# Patient Record
Sex: Male | Born: 1975
Health system: Southern US, Community
[De-identification: ages and names within clinical notes are randomized; demographics above are authoritative.]

## PROBLEM LIST (undated history)

## (undated) DIAGNOSIS — U071 COVID-19: Secondary | ICD-10-CM

## (undated) DIAGNOSIS — M109 Gout, unspecified: Secondary | ICD-10-CM

---

## 2008-10-16 ENCOUNTER — Emergency Department (HOSPITAL_COMMUNITY): Admission: EM | Admit: 2008-10-16 | Discharge: 2008-10-16 | Payer: Self-pay | Admitting: Emergency Medicine

## 2009-02-15 ENCOUNTER — Emergency Department (HOSPITAL_COMMUNITY): Admission: EM | Admit: 2009-02-15 | Discharge: 2009-02-16 | Payer: Self-pay | Admitting: Emergency Medicine

## 2010-06-29 LAB — POCT CARDIAC MARKERS
CKMB, poc: <1 ng/mL — ABNORMAL LOW (ref 1.0–8.0)
Myoglobin, poc: 53.8 ng/mL (ref 12–200)

## 2010-06-29 LAB — POCT I-STAT, CHEM 8
BUN: 6 mg/dL (ref 6–23)
Creatinine, Ser: 0.8 mg/dL (ref 0.4–1.5)
Potassium: 3.6 mEq/L (ref 3.5–5.1)
Sodium: 140 mEq/L (ref 135–145)

## 2010-06-29 LAB — CBC
Platelets: 221 10*3/uL (ref 150–400)
RBC: 5.76 MIL/uL (ref 4.22–5.81)
WBC: 7.5 10*3/uL (ref 4.0–10.5)

## 2010-06-29 LAB — D-DIMER, QUANTITATIVE: D-Dimer, Quant: <0.22 ug/mL-FEU (ref 0.00–0.48)

## 2010-06-29 LAB — DIFFERENTIAL
Lymphocytes Relative: 33 % (ref 12–46)
Lymphs Abs: 2.5 10*3/uL (ref 0.7–4.0)
Monocytes Relative: 4 % (ref 3–12)
Neutro Abs: 4.3 10*3/uL (ref 1.7–7.7)
Neutrophils Relative %: 58 % (ref 43–77)

## 2010-11-16 IMAGING — CR DG CHEST 2V
2 series · 2 of 2 positions shown · non-contrast
Comparison: None

CLINICAL DATA: Chest pain

CHEST - 2 VIEW

[w chest pa]
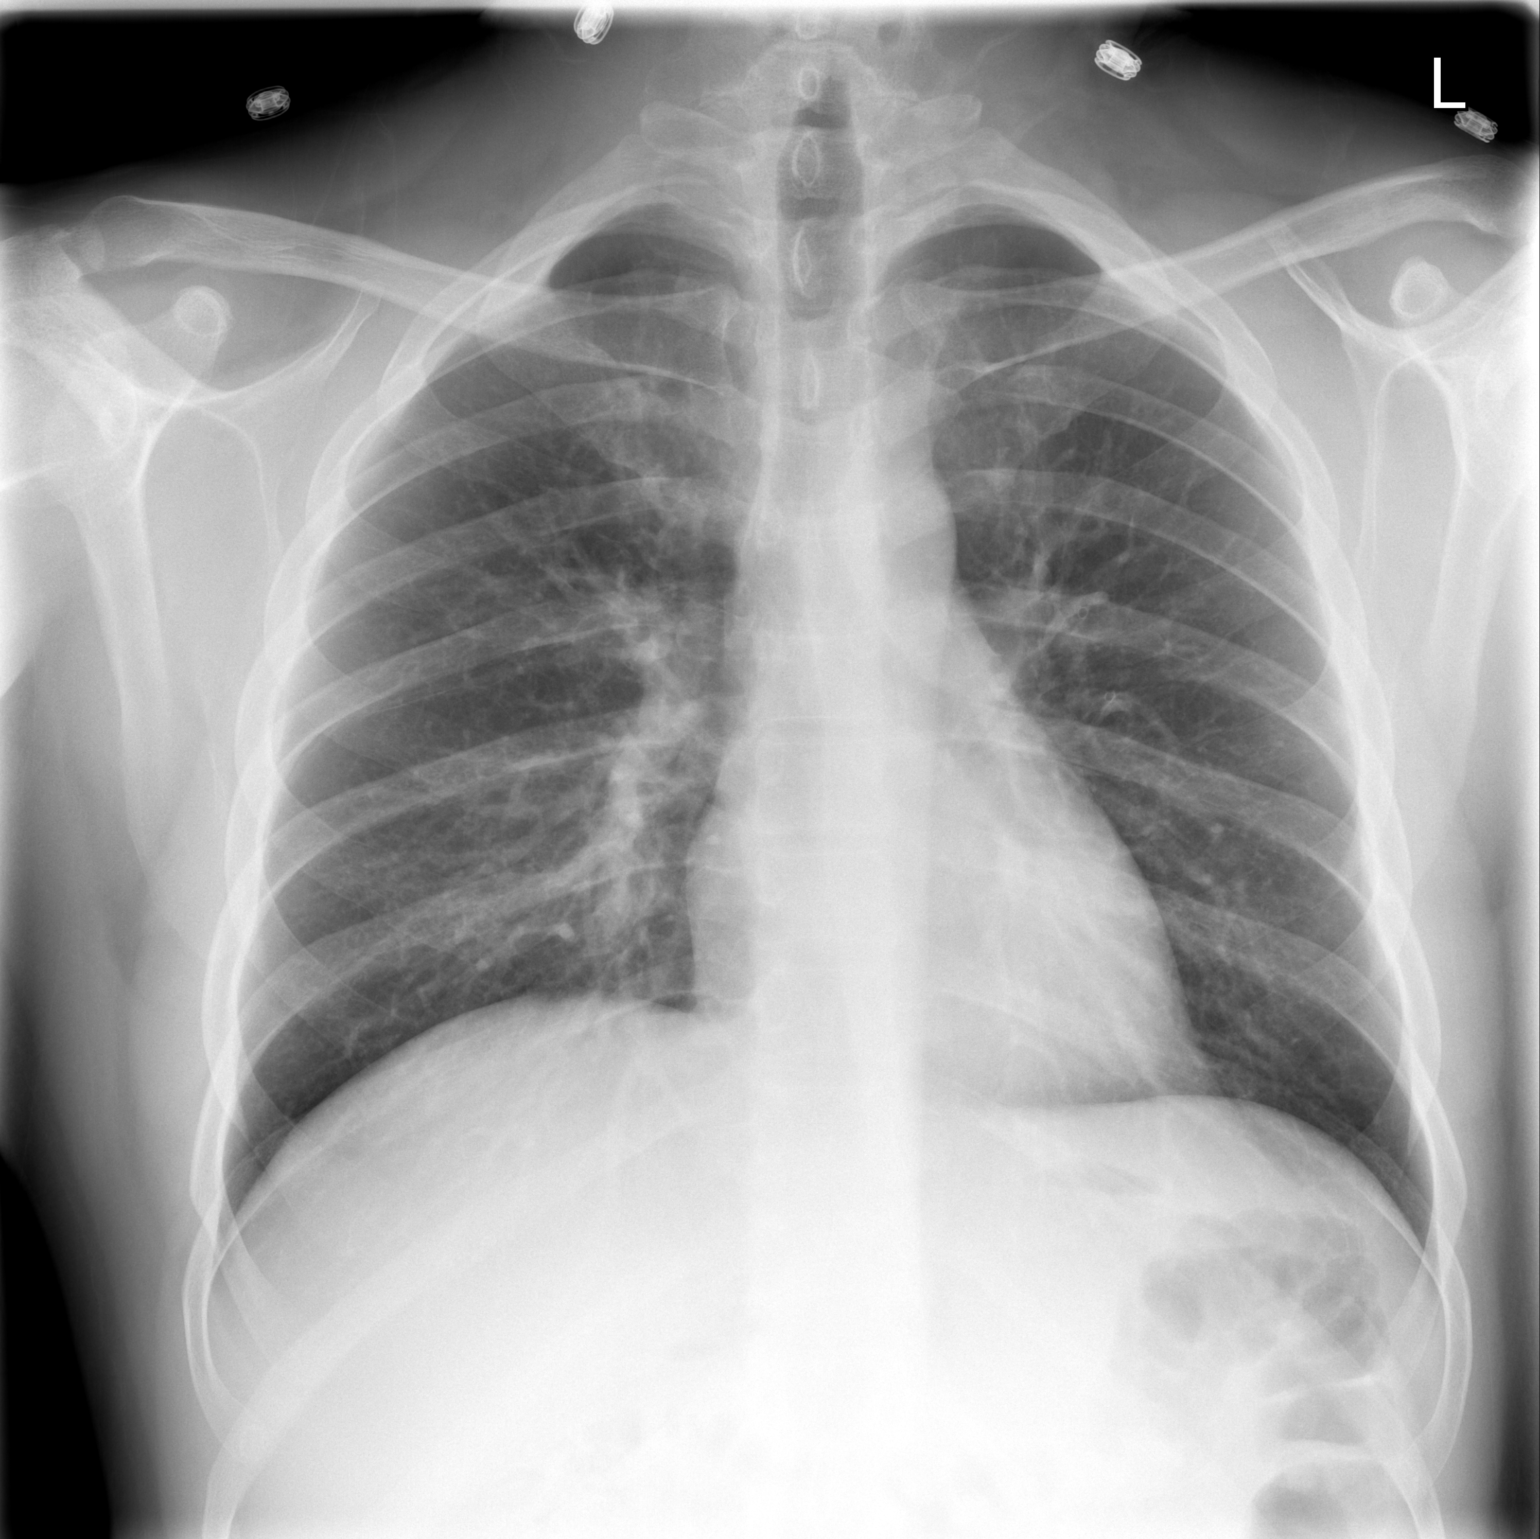

[w chest lat]
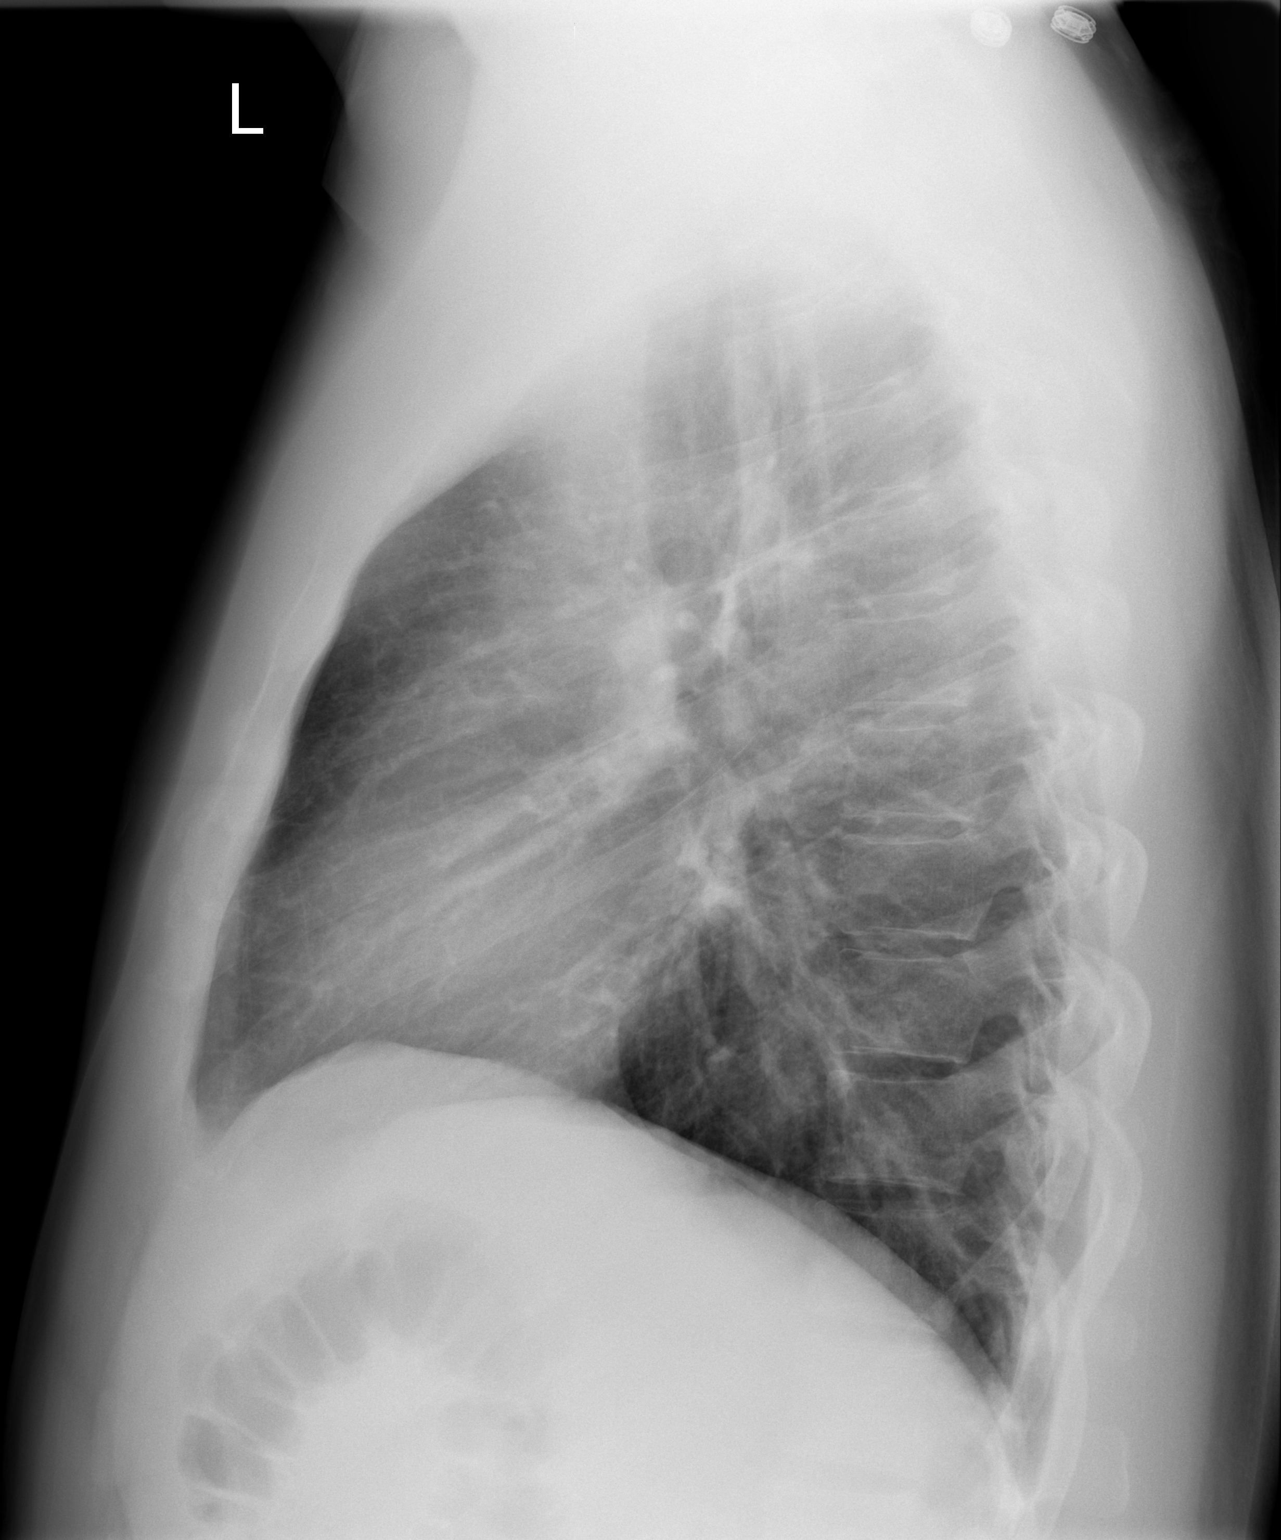

[2 of 2 positions shown; findings below may reference images not displayed]

FINDINGS: Heart and mediastinal contours are within normal limits.
No focal opacities or effusions.  No acute bony abnormality.
IMPRESSION: No acute findings.

## 2010-12-20 ENCOUNTER — Emergency Department (HOSPITAL_COMMUNITY)
Admission: EM | Admit: 2010-12-20 | Discharge: 2010-12-20 | Disposition: A | Discharge status: Home / Self Care | Payer: Worker's Compensation | Attending: Emergency Medicine | Admitting: Emergency Medicine

## 2010-12-20 DIAGNOSIS — IMO0002 Reserved for concepts with insufficient information to code with codable children: Secondary | ICD-10-CM | POA: Insufficient documentation

## 2010-12-20 DIAGNOSIS — W1809XA Striking against other object with subsequent fall, initial encounter: Secondary | ICD-10-CM | POA: Insufficient documentation

## 2011-08-20 ENCOUNTER — Encounter (HOSPITAL_COMMUNITY): Payer: Self-pay | Admitting: *Deleted

## 2011-08-20 ENCOUNTER — Emergency Department (HOSPITAL_COMMUNITY)
Admission: EM | Admit: 2011-08-20 | Discharge: 2011-08-20 | Disposition: A | Discharge status: Home / Self Care | Payer: Self-pay | Attending: Emergency Medicine | Admitting: Emergency Medicine

## 2011-08-20 DIAGNOSIS — M109 Gout, unspecified: Secondary | ICD-10-CM | POA: Insufficient documentation

## 2011-08-20 MED ORDER — OXYCODONE-ACETAMINOPHEN 5-325 MG PO TABS: 1.0000 | ORAL_TABLET | ORAL | Status: DC | PRN

## 2011-08-20 MED ORDER — IBUPROFEN 200 MG PO TABS
ORAL_TABLET | ORAL | Status: AC
  Filled 2011-08-20: qty 3

## 2011-08-20 MED ORDER — IBUPROFEN 600 MG PO TABS: 600.0000 mg | ORAL_TABLET | Freq: Three times a day (TID) | ORAL | Status: DC | PRN

## 2011-08-20 MED ORDER — IBUPROFEN 200 MG PO TABS
600.0000 mg | ORAL_TABLET | Freq: Once | ORAL | Status: AC
  Administered 2011-08-20: 600 mg via ORAL

## 2011-08-20 NOTE — Discharge Instructions (Signed)
Gout Gout is an inflammatory condition (arthritis) caused by a buildup of uric acid crystals in the joints. Uric acid is a chemical that is normally present in the blood. Under some circumstances, uric acid can form into crystals in your joints. This causes joint redness, soreness, and swelling (inflammation). Repeat attacks are common. Over time, uric acid crystals can form into masses (tophi) near a joint, causing disfigurement. Gout is treatable and often preventable. CAUSES  The disease begins with elevated levels of uric acid in the blood. Uric acid is produced by your body when it breaks down a naturally found substance called purines. This also happens when you eat certain foods such as meats and fish. Causes of an elevated uric acid level include:  Being passed down from parent to child (heredity).   Diseases that cause increased uric acid production (obesity, psoriasis, some cancers).   Excessive alcohol use.   Diet, especially diets rich in meat and seafood.   Medicines, including certain cancer-fighting drugs (chemotherapy), diuretics, and aspirin.   Chronic kidney disease. The kidneys are no longer able to remove uric acid well.   Problems with metabolism.  Conditions strongly associated with gout include:  Obesity.   High blood pressure.   High cholesterol.   Diabetes.  Not everyone with elevated uric acid levels gets gout. It is not understood why some people get gout and others do not. Surgery, joint injury, and eating too much of certain foods are some of the factors that can lead to gout. SYMPTOMS   An attack of gout comes on quickly. It causes intense pain with redness, swelling, and warmth in a joint.   Fever can occur.   Often, only one joint is involved. Certain joints are more commonly involved:   Base of the big toe.   Knee.   Ankle.   Wrist.   Finger.  Without treatment, an attack usually goes away in a few days to weeks. Between attacks, you  usually will not have symptoms, which is different from many other forms of arthritis. DIAGNOSIS  Your caregiver will suspect gout based on your symptoms and exam. Removal of fluid from the joint (arthrocentesis) is done to check for uric acid crystals. Your caregiver will give you a medicine that numbs the area (local anesthetic) and use a needle to remove joint fluid for exam. Gout is confirmed when uric acid crystals are seen in joint fluid, using a special microscope. Sometimes, blood, urine, and X-ray tests are also used. TREATMENT  There are 2 phases to gout treatment: treating the sudden onset (acute) attack and preventing attacks (prophylaxis). Treatment of an Acute Attack  Medicines are used. These include anti-inflammatory medicines or steroid medicines.   An injection of steroid medicine into the affected joint is sometimes necessary.   The painful joint is rested. Movement can worsen the arthritis.   You may use warm or cold treatments on painful joints, depending which works best for you.   Discuss the use of coffee, vitamin C, or cherries with your caregiver. These may be helpful treatment options.  Treatment to Prevent Attacks After the acute attack subsides, your caregiver may advise prophylactic medicine. These medicines either help your kidneys eliminate uric acid from your body or decrease your uric acid production. You may need to stay on these medicines for a very long time. The early phase of treatment with prophylactic medicine can be associated with an increase in acute gout attacks. For this reason, during the first few months   of treatment, your caregiver may also advise you to take medicines usually used for acute gout treatment. Be sure you understand your caregiver's directions. You should also discuss dietary treatment with your caregiver. Certain foods such as meats and fish can increase uric acid levels. Other foods such as dairy can decrease levels. Your caregiver  can give you a list of foods to avoid. HOME CARE INSTRUCTIONS   Do not take aspirin to relieve pain. This raises uric acid levels.   Only take over-the-counter or prescription medicines for pain, discomfort, or fever as directed by your caregiver.   Rest the joint as much as possible. When in bed, keep sheets and blankets off painful areas.   Keep the affected joint raised (elevated).   Use crutches if the painful joint is in your leg.   Drink enough water and fluids to keep your urine clear or pale yellow. This helps your body get rid of uric acid. Do not drink alcoholic beverages. They slow the passage of uric acid.   Follow your caregiver's dietary instructions. Pay careful attention to the amount of protein you eat. Your daily diet should emphasize fruits, vegetables, whole grains, and fat-free or low-fat milk products.   Maintain a healthy body weight.  SEEK MEDICAL CARE IF:   You have an oral temperature above 102 F (38.9 C).   You develop diarrhea, vomiting, or any side effects from medicines.   You do not feel better in 24 hours, or you are getting worse.  SEEK IMMEDIATE MEDICAL CARE IF:   Your joint becomes suddenly more tender and you have:   Chills.   An oral temperature above 102 F (38.9 C), not controlled by medicine.  MAKE SURE YOU:   Understand these instructions.   Will watch your condition.   Will get help right away if you are not doing well or get worse.  Document Released: 03/10/2000 Document Revised: 03/02/2011 Document Reviewed: 06/21/2009 [employer] Patient Information 2012 [employer].Purine Restricted Diet A low-purine diet consists of foods that reduce uric acid made in your body. INDICATIONS FOR USE  Your caregiver may ask you to follow a low-purine diet to reduce gout flairs.  GUIDELINES  Avoid high-purine foods, including all alcohol, yeast extracts taken as supplements, and sauces made from meats (like gravy). Do not eat high-purine  meats, including anchovies, sardines, herring, mussels, tuna, codfish, scallops, trout, haddock, bacon, organ meats, tripe, goose, wild game, and sweetbreads.  Grains  Allowed/Recommended: All, except those listed to consume in moderation.   Consume in Moderation: Oatmeal (? cup uncooked daily), wheat bran or germ ( cup daily), and whole grains.  Vegetables  Allowed/Recommended: All, except those listed to consume in moderation.   Consume in Moderation: Asparagus, cauliflower, spinach, mushrooms, and green peas ( cup daily).  Fruit  Allowed/Recommended: All.   Consume in Moderation: None.  Meat and Meat Substitutes  Allowed/Recommended: Eggs, nuts, and peanut butter.   Consume in Moderation: Limit to 4 to 6 oz daily. Avoid high-purine meats. Lentils, peas, and dried beans (1 cup daily).  Milk  Allowed/Recommended: All. Choose low-fat or skim when possible.   Consume in Moderation: None.  Fats and Oils  Allowed/Recommended: All.   Consume in Moderation: None.  Beverages  Allowed/Recommended: All, except those listed to avoid.   Avoid: All alcohol.  Condiments/Miscellaneous  Allowed/Recommended: All, except those listed to consume in moderation.   Consume in Moderation: Bouillon and meat-based broths and soups.  Document Released: 07/08/2010 Document Revised: 03/02/2011 Document   Reviewed: 07/08/2010 [employer] Patient Information 2012 [employer]. 

## 2011-08-20 NOTE — ED Notes (Signed)
Pt. Has c/o left toe pain that started Friday after coming from the park.  Pt. reports that pt. Stopped on his toe but he does not know what is causing the pain.  Pt. Denies any injury and reports pain with ambulation.

## 2011-08-20 NOTE — ED Provider Notes (Signed)
History   This chart was scribed for Lyanne Co, MD by Toya Smothers. The patient was seen in room STRE4/STRE4. Patient's care was started at 1038.  CSN: [id number]  Arrival date & time 08/20/11  1038   First MD Initiated Contact with Patient 08/20/11 1105      Chief Complaint  Patient presents with  . Toe Pain   Patient is a 36 y.o. male presenting with toe pain. The history is provided by the patient. No language interpreter was used.  Toe Pain Pertinent negatives include no shortness of breath.    Larry Shannon is a 36 y.o. male who presents to the Emergency Department complaining of sudden onset moderate severe constant worsening left toe pain onset last night listing no associate symptoms and without injury, denying fever and chill.Pt has a h/o alcohol use and a diet of red meat.   History reviewed. No pertinent past medical history.  History reviewed. No pertinent past surgical history.  History reviewed. No pertinent family history.  History  Substance Use Topics  . Smoking status: Not on file  . Smokeless tobacco: Not on file  . Alcohol Use: No    Review of Systems  Constitutional: Negative for fever and chills.  Respiratory: Negative for shortness of breath.   Gastrointestinal: Negative for nausea and vomiting.  Musculoskeletal:       Left toe pain  Neurological: Negative for weakness.   A complete 10 system review of systems was obtained and all systems are negative except as noted in the HPI and PMH.   Allergies  Review of patient's allergies indicates no known allergies.  Home Medications  No current outpatient prescriptions on file.  BP 132/78  Pulse 78  Temp(Src) 97.8 F (36.6 C) (Oral)  Resp 19  SpO2 100%  Physical Exam  Nursing note and vitals reviewed. Constitutional: He is oriented to person, place, and time. He appears well-developed and well-nourished. No distress.  HENT:  Head: Normocephalic and atraumatic.  Eyes: EOM are normal.  Pupils are equal, round, and reactive to light.  Neck: Neck supple. No tracheal deviation present.  Cardiovascular: Normal rate.   Pulmonary/Chest: Effort normal. No respiratory distress.  Abdominal: Soft. He exhibits no distension.  Musculoskeletal: Normal range of motion. He exhibits no edema.       Mild erethema L great toe proximal phalynx, pain w/ ROm in L toe, Perfusion of L great toe  Neurological: He is alert and oriented to person, place, and time. No sensory deficit.  Skin: Skin is warm and dry.  Psychiatric: He has a normal mood and affect. His behavior is normal.    ED Course  Procedures (including critical care time) DIAGNOSTIC STUDIES: Oxygen Saturation is 100% on room air, normal by my interpretation.    COORDINATION OF CARE:   Labs Reviewed - No data to display No results found.   1. Gout       MDM  Patient has what appears to be gout of his left great toe.  He was instructed in dietary changes.  He's been instructed to return to the ER for new or resting symptoms including signs of infection such as high fever or spreading redness.   I personally performed the services described in this documentation, which was scribed in my presence. The recorded information has been reviewed and considered.           Lyanne Co, MD 08/20/11 [phone]

## 2011-08-25 ENCOUNTER — Emergency Department (HOSPITAL_COMMUNITY)
Admission: EM | Admit: 2011-08-25 | Discharge: 2011-08-25 | Disposition: A | Discharge status: Home / Self Care | Payer: Self-pay | Attending: Emergency Medicine | Admitting: Emergency Medicine

## 2011-08-25 ENCOUNTER — Emergency Department (HOSPITAL_COMMUNITY): Payer: Self-pay

## 2011-08-25 ENCOUNTER — Encounter (HOSPITAL_COMMUNITY): Payer: Self-pay | Admitting: Emergency Medicine

## 2011-08-25 DIAGNOSIS — R7989 Other specified abnormal findings of blood chemistry: Secondary | ICD-10-CM | POA: Insufficient documentation

## 2011-08-25 DIAGNOSIS — E790 Hyperuricemia without signs of inflammatory arthritis and tophaceous disease: Secondary | ICD-10-CM

## 2011-08-25 DIAGNOSIS — M109 Gout, unspecified: Secondary | ICD-10-CM

## 2011-08-25 LAB — URIC ACID: Uric Acid, Serum: 9.3 mg/dL — ABNORMAL HIGH (ref 4.0–7.8)

## 2011-08-25 MED ORDER — PREDNISONE 20 MG PO TABS: 60.0000 mg | ORAL_TABLET | Freq: Every day | ORAL | Status: DC

## 2011-08-25 MED ORDER — INDOMETHACIN 50 MG PO CAPS: 50.0000 mg | ORAL_CAPSULE | Freq: Three times a day (TID) | ORAL | Status: DC

## 2011-08-25 MED ORDER — INDOMETHACIN 25 MG PO CAPS
50.0000 mg | ORAL_CAPSULE | Freq: Once | ORAL | Status: AC
  Administered 2011-08-25: 50 mg via ORAL
  Filled 2011-08-25: qty 2

## 2011-08-25 MED ORDER — OXYCODONE-ACETAMINOPHEN 5-325 MG PO TABS
2.0000 | ORAL_TABLET | Freq: Once | ORAL | Status: AC
  Administered 2011-08-25: 2 via ORAL
  Filled 2011-08-25: qty 2

## 2011-08-25 MED ORDER — PREDNISONE 20 MG PO TABS
60.0000 mg | ORAL_TABLET | Freq: Once | ORAL | Status: AC
  Administered 2011-08-25: 60 mg via ORAL
  Filled 2011-08-25: qty 3

## 2011-08-25 MED ORDER — OXYCODONE-ACETAMINOPHEN 7.5-325 MG PO TABS: 1.0000 | ORAL_TABLET | ORAL | Status: DC | PRN

## 2011-08-25 NOTE — ED Notes (Signed)
Pt with no obvious, swelling, bruising or deformities to left great toe. Distal circulation intact. Pt able to wiggle toes. States he was seen here and dx with gout but the pain medication isn't helping and his toe doesn't seem to be getting any better.

## 2011-08-25 NOTE — Discharge Instructions (Signed)
Gout Gout is an inflammatory condition (arthritis) caused by a buildup of uric acid crystals in the joints. Uric acid is a chemical that is normally present in the blood. Under some circumstances, uric acid can form into crystals in your joints. This causes joint redness, soreness, and swelling (inflammation). Repeat attacks are common. Over time, uric acid crystals can form into masses (tophi) near a joint, causing disfigurement. Gout is treatable and often preventable. CAUSES  The disease begins with elevated levels of uric acid in the blood. Uric acid is produced by your body when it breaks down a naturally found substance called purines. This also happens when you eat certain foods such as meats and fish. Causes of an elevated uric acid level include:  Being passed down from parent to child (heredity).   Diseases that cause increased uric acid production (obesity, psoriasis, some cancers).   Excessive alcohol use.   Diet, especially diets rich in meat and seafood.   Medicines, including certain cancer-fighting drugs (chemotherapy), diuretics, and aspirin.   Chronic kidney disease. The kidneys are no longer able to remove uric acid well.   Problems with metabolism.  Conditions strongly associated with gout include:  Obesity.   High blood pressure.   High cholesterol.   Diabetes.  Not everyone with elevated uric acid levels gets gout. It is not understood why some people get gout and others do not. Surgery, joint injury, and eating too much of certain foods are some of the factors that can lead to gout. SYMPTOMS   An attack of gout comes on quickly. It causes intense pain with redness, swelling, and warmth in a joint.   Fever can occur.   Often, only one joint is involved. Certain joints are more commonly involved:   Base of the big toe.   Knee.   Ankle.   Wrist.   Finger.  Without treatment, an attack usually goes away in a few days to weeks. Between attacks, you  usually will not have symptoms, which is different from many other forms of arthritis. DIAGNOSIS  Your caregiver will suspect gout based on your symptoms and exam. Removal of fluid from the joint (arthrocentesis) is done to check for uric acid crystals. Your caregiver will give you a medicine that numbs the area (local anesthetic) and use a needle to remove joint fluid for exam. Gout is confirmed when uric acid crystals are seen in joint fluid, using a special microscope. Sometimes, blood, urine, and X-ray tests are also used. TREATMENT  There are 2 phases to gout treatment: treating the sudden onset (acute) attack and preventing attacks (prophylaxis). Treatment of an Acute Attack  Medicines are used. These include anti-inflammatory medicines or steroid medicines.   An injection of steroid medicine into the affected joint is sometimes necessary.   The painful joint is rested. Movement can worsen the arthritis.   You may use warm or cold treatments on painful joints, depending which works best for you.   Discuss the use of coffee, vitamin C, or cherries with your caregiver. These may be helpful treatment options.  Treatment to Prevent Attacks After the acute attack subsides, your caregiver may advise prophylactic medicine. These medicines either help your kidneys eliminate uric acid from your body or decrease your uric acid production. You may need to stay on these medicines for a very long time. The early phase of treatment with prophylactic medicine can be associated with an increase in acute gout attacks. For this reason, during the first few months   of treatment, your caregiver may also advise you to take medicines usually used for acute gout treatment. Be sure you understand your caregiver's directions. You should also discuss dietary treatment with your caregiver. Certain foods such as meats and fish can increase uric acid levels. Other foods such as dairy can decrease levels. Your caregiver  can give you a list of foods to avoid. HOME CARE INSTRUCTIONS   Do not take aspirin to relieve pain. This raises uric acid levels.   Only take over-the-counter or prescription medicines for pain, discomfort, or fever as directed by your caregiver.   Rest the joint as much as possible. When in bed, keep sheets and blankets off painful areas.   Keep the affected joint raised (elevated).   Use crutches if the painful joint is in your leg.   Drink enough water and fluids to keep your urine clear or pale yellow. This helps your body get rid of uric acid. Do not drink alcoholic beverages. They slow the passage of uric acid.   Follow your caregiver's dietary instructions. Pay careful attention to the amount of protein you eat. Your daily diet should emphasize fruits, vegetables, whole grains, and fat-free or low-fat milk products.   Maintain a healthy body weight.  SEEK MEDICAL CARE IF:   You have an oral temperature above 102 F (38.9 C).   You develop diarrhea, vomiting, or any side effects from medicines.   You do not feel better in 24 hours, or you are getting worse.  SEEK IMMEDIATE MEDICAL CARE IF:   Your joint becomes suddenly more tender and you have:   Chills.   An oral temperature above 102 F (38.9 C), not controlled by medicine.  MAKE SURE YOU:   Understand these instructions.   Will watch your condition.   Will get help right away if you are not doing well or get worse.  Document Released: 03/10/2000 Document Revised: 03/02/2011 Document Reviewed: 06/21/2009 [facility] Patient Information 2012 [place], [place].  Purine Restricted Diet A low-purine diet consists of foods that reduce uric acid made in your body. INDICATIONS FOR USE  Your caregiver may ask you to follow a low-purine diet to reduce gout flairs.  GUIDELINES  Avoid high-purine foods, including all alcohol, yeast extracts taken as supplements, and sauces made from meats (like gravy). Do not eat high-purine  meats, including anchovies, sardines, herring, mussels, tuna, codfish, scallops, trout, haddock, bacon, organ meats, tripe, goose, wild game, and sweetbreads.  Grains  Allowed/Recommended: All, except those listed to consume in moderation.   Consume in Moderation: Oatmeal (? cup uncooked daily), wheat bran or germ ( cup daily), and whole grains.  Vegetables  Allowed/Recommended: All, except those listed to consume in moderation.   Consume in Moderation: Asparagus, cauliflower, spinach, mushrooms, and green peas ( cup daily).  Fruit  Allowed/Recommended: All.   Consume in Moderation: None.  Meat and Meat Substitutes  Allowed/Recommended: Eggs, nuts, and peanut butter.   Consume in Moderation: Limit to 4 to 6 oz daily. Avoid high-purine meats. Lentils, peas, and dried beans (1 cup daily).  Milk  Allowed/Recommended: All. Choose low-fat or skim when possible.   Consume in Moderation: None.  Fats and Oils  Allowed/Recommended: All.   Consume in Moderation: None.  Beverages  Allowed/Recommended: All, except those listed to avoid.   Avoid: All alcohol.  Condiments/Miscellaneous  Allowed/Recommended: All, except those listed to consume in moderation.   Consume in Moderation: Bouillon and meat-based broths and soups.  Document Released: 07/08/2010 Document Revised:  03/02/2011 Document Reviewed: 07/08/2010 [facility] Patient Information 2012 [place], [place].  Indomethacin capsules What is this medicine? INDOMETHACIN (in doe METH a sin) is a non-steroidal anti-inflammatory drug (NSAID). It is used to reduce swelling and to treat pain. It may be used for painful joint and muscular problems such as arthritis, tendinitis, bursitis, and gout. This medicine may be used for other purposes; ask your health care provider or pharmacist if you have questions. What should I tell my health care provider before I take this medicine? They need to know if you have any of these  conditions: -asthma, especially aspirin sensitive asthma -coronary artery bypass graft (CABG) surgery within the past 2 weeks -depression -drink more than 3 alcohol containing drinks a day -heart disease or circulation problems like heart failure or leg edema (fluid retention) -high blood pressure -kidney disease -liver disease -Parkinson's disease -seizures -stomach bleeding or ulcers -an unusual or allergic reaction to indomethacin, aspirin, other NSAIDs, other medicines, foods, dyes, or preservatives -pregnant or trying to get pregnant -breast-feeding How should I use this medicine? Take this medicine by mouth with food and with a full glass of water. Follow the directions on the prescription label. Take your medicine at regular intervals. Do not take your medicine more often than directed. Long-term, continuous use may increase the risk of heart attack or stroke. A special MedGuide will be given to you by the pharmacist with each prescription and refill. Be sure to read this information carefully each time. Talk to your pediatrician regarding the use of this medicine in children. Special care may be needed. While this drug may be prescribed for children as young as 15 years for selected conditions, precautions do apply. Elderly patients over 7 years old may have a stronger reaction and need a smaller dose. Overdosage: If you think you have taken too much of this medicine contact a poison control center or emergency room at once. NOTE: This medicine is only for you. Do not share this medicine with others. What if I miss a dose? If you miss a dose, take it as soon as you can. If it is almost time for your next dose, take only that dose. Do not take double or extra doses. What may interact with this medicine? Do not take this medicine with any of the following medications: -cidofovir -diflunisal -ketorolac -methotrexate -pemetrexed -triamterene This medicine may also interact with  the following medications: -alcohol -antacids -aspirin and aspirin-like medicines -cyclosporine -digoxin -diuretics -lithium -medicines for diabetes -medicines for high blood pressure -medicines that affect platelets -medicines that treat or prevent blood clots like warfarin -NSAIDs, medicines for pain and inflammation, like ibuprofen or naproxen -probenecid -steroid medicines like prednisone or cortisone This list may not describe all possible interactions. Give your health care provider a list of all the medicines, herbs, non-prescription drugs, or dietary supplements you use. Also tell them if you smoke, drink alcohol, or use illegal drugs. Some items may interact with your medicine. What should I watch for while using this medicine? Tell your doctor or health care professional if your pain does not get better. Talk to your doctor before taking another medicine for pain. Do not treat yourself. This medicine does not prevent heart attack or stroke. In fact, this medicine may increase the chance of a heart attack or stroke. The chance may increase with longer use of this medicine and in people who have heart disease. If you take aspirin to prevent heart attack or stroke, talk with your doctor or  health care professional. Do not take medicines such as ibuprofen and naproxen with this medicine. Side effects such as stomach upset, nausea, or ulcers may be more likely to occur. Many medicines available without a prescription should not be taken with this medicine. This medicine can cause ulcers and bleeding in the stomach and intestines at any time during treatment. Do not smoke cigarettes or drink alcohol. These increase irritation to your stomach and can make it more susceptible to damage from this medicine. Ulcers and bleeding can happen without warning symptoms and can cause death. You may get drowsy or dizzy. Do not drive, use machinery, or do anything that needs mental alertness until you  know how this medicine affects you. Do not stand or sit up quickly, especially if you are an older patient. This reduces the risk of dizzy or fainting spells. This medicine can cause you to bleed more easily. Try to avoid damage to your teeth and gums when you brush or floss your teeth. What side effects may I notice from receiving this medicine? Side effects that you should report to your doctor or health care professional as soon as possible: -allergic reactions like skin rash, itching or hives, swelling of the face, lips, or tongue -black or bloody stools, blood in the urine or vomit -blurred vision -chest pain -difficulty breathing or wheezing -nausea or vomiting -slurred speech or weakness on one side of the body -unexplained weight gain or swelling -unusually weak or tired -yellowing of eyes or skin Side effects that usually do not require medical attention (report to your doctor or health care professional if they continue or are bothersome): -diarrhea -dizziness -headache -heartburn This list may not describe all possible side effects. Call your doctor for medical advice about side effects. You may report side effects to FDA at 1-800-FDA-1088. Where should I keep my medicine? Keep out of the reach of children. Store at room temperature between 15 and 30 degrees C (59 and 86 degrees F). Keep container tightly closed. Throw away any unused medicine after the expiration date. NOTE: This sheet is a summary. It may not cover all possible information. If you have questions about this medicine, talk to your doctor, pharmacist, or health care provider.  2012, [employer]. (07/30/2007 1:35:33 PM)   Prednisone tablets What is this medicine? PREDNISONE (PRED ni sone) is a corticosteroid. It is commonly used to treat inflammation of the skin, joints, lungs, and other organs. Common conditions treated include asthma, allergies, and arthritis. It is also used for other conditions,  such as blood disorders and diseases of the adrenal glands. This medicine may be used for other purposes; ask your health care provider or pharmacist if you have questions. What should I tell my health care provider before I take this medicine? They need to know if you have any of these conditions: -Cushing's syndrome -diabetes -glaucoma -heart disease -high blood pressure -infection (especially a virus infection such as chickenpox, cold sores, or herpes) -kidney disease -liver disease -mental illness -myasthenia gravis -osteoporosis -seizures -stomach or intestine problems -thyroid disease -an unusual or allergic reaction to lactose, prednisone, other medicines, foods, dyes, or preservatives -pregnant or trying to get pregnant -breast-feeding How should I use this medicine? Take this medicine by mouth with a glass of water. Follow the directions on the prescription label. Take this medicine with food. If you are taking this medicine once a day, take it in the morning. Do not take more medicine than you are told to take. Do  not suddenly stop taking your medicine because you may develop a severe reaction. Your doctor will tell you how much medicine to take. If your doctor wants you to stop the medicine, the dose may be slowly lowered over time to avoid any side effects. Talk to your pediatrician regarding the use of this medicine in children. Special care may be needed. Overdosage: If you think you have taken too much of this medicine contact a poison control center or emergency room at once. NOTE: This medicine is only for you. Do not share this medicine with others. What if I miss a dose? If you miss a dose, take it as soon as you can. If it is almost time for your next dose, talk to your doctor or health care professional. You may need to miss a dose or take an extra dose. Do not take double or extra doses without advice. What may interact with this medicine? Do not take this medicine  with any of the following medications: -metyrapone -mifepristone This medicine may also interact with the following medications: -aminoglutethimide -amphotericin B -aspirin and aspirin-like medicines -barbiturates -certain medicines for diabetes, like glipizide or glyburide -cholestyramine -cholinesterase inhibitors -cyclosporine -digoxin -diuretics -ephedrine -male hormones, like estrogens and birth control pills -isoniazid -ketoconazole -NSAIDS, medicines for pain and inflammation, like ibuprofen or naproxen -phenytoin -rifampin -toxoids -vaccines -warfarin This list may not describe all possible interactions. Give your health care provider a list of all the medicines, herbs, non-prescription drugs, or dietary supplements you use. Also tell them if you smoke, drink alcohol, or use illegal drugs. Some items may interact with your medicine. What should I watch for while using this medicine? Visit your doctor or health care professional for regular checks on your progress. If you are taking this medicine over a prolonged period, carry an identification card with your name and address, the type and dose of your medicine, and your doctor's name and address. This medicine may increase your risk of getting an infection. Tell your doctor or health care professional if you are around anyone with measles or chickenpox, or if you develop sores or blisters that do not heal properly. If you are going to have surgery, tell your doctor or health care professional that you have taken this medicine within the last twelve months. Ask your doctor or health care professional about your diet. You may need to lower the amount of salt you eat. This medicine may affect blood sugar levels. If you have diabetes, check with your doctor or health care professional before you change your diet or the dose of your diabetic medicine. What side effects may I notice from receiving this medicine? Side effects that  you should report to your doctor or health care professional as soon as possible: -allergic reactions like skin rash, itching or hives, swelling of the face, lips, or tongue -changes in emotions or moods -changes in vision -depressed mood -eye pain -fever or chills, cough, sore throat, pain or difficulty passing urine -increased thirst -swelling of ankles, feet Side effects that usually do not require medical attention (report to your doctor or health care professional if they continue or are bothersome): -confusion, excitement, restlessness -headache -nausea, vomiting -skin problems, acne, thin and shiny skin -trouble sleeping -weight gain This list may not describe all possible side effects. Call your doctor for medical advice about side effects. You may report side effects to FDA at 1-800-FDA-1088. Where should I keep my medicine? Keep out of the reach of children. Store at  room temperature between 15 and 30 degrees C (59 and 86 degrees F). Protect from light. Keep container tightly closed. Throw away any unused medicine after the expiration date. NOTE: This sheet is a summary. It may not cover all possible information. If you have questions about this medicine, talk to your doctor, pharmacist, or health care provider.  2012, [employer]. (10/27/2010 10:57:14 AM)  Acetaminophen; Oxycodone tablets What is this medicine? ACETAMINOPHEN; OXYCODONE (a set a MEE noe fen; ox i KOE done) is a pain reliever. It is used to treat mild to moderate pain. This medicine may be used for other purposes; ask your health care provider or pharmacist if you have questions. What should I tell my health care provider before I take this medicine? They need to know if you have any of these conditions: -brain tumor -Crohn's disease, inflammatory bowel disease, or ulcerative colitis -drink more than 3 alcohol containing drinks per day -drug abuse or addiction -head injury -heart or circulation  problems -kidney disease or problems going to the bathroom -liver disease -lung disease, asthma, or breathing problems -an unusual or allergic reaction to acetaminophen, oxycodone, other opioid analgesics, other medicines, foods, dyes, or preservatives -pregnant or trying to get pregnant -breast-feeding How should I use this medicine? Take this medicine by mouth with a full glass of water. Follow the directions on the prescription label. Take your medicine at regular intervals. Do not take your medicine more often than directed. Talk to your pediatrician regarding the use of this medicine in children. Special care may be needed. Patients over 24 years old may have a stronger reaction and need a smaller dose. Overdosage: If you think you have taken too much of this medicine contact a poison control center or emergency room at once. NOTE: This medicine is only for you. Do not share this medicine with others. What if I miss a dose? If you miss a dose, take it as soon as you can. If it is almost time for your next dose, take only that dose. Do not take double or extra doses. What may interact with this medicine? -alcohol or medicines that contain alcohol -antihistamines -barbiturates like amobarbital, butalbital, butabarbital, methohexital, pentobarbital, phenobarbital, thiopental, and secobarbital -benztropine -drugs for bladder problems like solifenacin, trospium, oxybutynin, tolterodine, hyoscyamine, and methscopolamine -drugs for breathing problems like ipratropium and tiotropium -drugs for certain stomach or intestine problems like propantheline, homatropine methylbromide, glycopyrrolate, atropine, belladonna, and dicyclomine -general anesthetics like etomidate, ketamine, nitrous oxide, propofol, desflurane, enflurane, halothane, isoflurane, and sevoflurane -medicines for depression, anxiety, or psychotic disturbances -medicines for pain like codeine, morphine, pentazocine, buprenorphine,  butorphanol, nalbuphine, tramadol, and propoxyphene -medicines for sleep -muscle relaxants -naltrexone -phenothiazines like perphenazine, thioridazine, chlorpromazine, mesoridazine, fluphenazine, prochlorperazine, promazine, and trifluoperazine -scopolamine -trihexyphenidyl This list may not describe all possible interactions. Give your health care provider a list of all the medicines, herbs, non-prescription drugs, or dietary supplements you use. Also tell them if you smoke, drink alcohol, or use illegal drugs. Some items may interact with your medicine. What should I watch for while using this medicine? Tell your doctor or health care professional if your pain does not go away, if it gets worse, or if you have new or a different type of pain. You may develop tolerance to the medicine. Tolerance means that you will need a higher dose of the medication for pain relief. Tolerance is normal and is expected if you take this medicine for a long time. Do not suddenly stop taking your medicine because  you may develop a severe reaction. Your body becomes used to the medicine. This does NOT mean you are addicted. Addiction is a behavior related to getting and using a drug for a nonmedical reason. If you have pain, you have a medical reason to take pain medicine. Your doctor will tell you how much medicine to take. If your doctor wants you to stop the medicine, the dose will be slowly lowered over time to avoid any side effects. You may get drowsy or dizzy. Do not drive, use machinery, or do anything that needs mental alertness until you know how this medicine affects you. Do not stand or sit up quickly, especially if you are an older patient. This reduces the risk of dizzy or fainting spells. Alcohol may interfere with the effect of this medicine. Avoid alcoholic drinks. The medicine will cause constipation. Try to have a bowel movement at least every 2 to 3 days. If you do not have a bowel movement for 3 days,  call your doctor or health care professional. Do not take Tylenol (acetaminophen) or medicines that have acetaminophen with this medicine. Too much acetaminophen can be very dangerous. Many nonprescription medicines contain acetaminophen. Always read the labels carefully to avoid taking more acetaminophen. What side effects may I notice from receiving this medicine? Side effects that you should report to your doctor or health care professional as soon as possible: -allergic reactions like skin rash, itching or hives, swelling of the face, lips, or tongue -breathing difficulties, wheezing -confusion -light headedness or fainting spells -severe stomach pain -yellowing of the skin or the whites of the eyes Side effects that usually do not require medical attention (report to your doctor or health care professional if they continue or are bothersome): -dizziness -drowsiness -nausea -vomiting This list may not describe all possible side effects. Call your doctor for medical advice about side effects. You may report side effects to FDA at 1-800-FDA-1088. Where should I keep my medicine? Keep out of the reach of children. This medicine can be abused. Keep your medicine in a safe place to protect it from theft. Do not share this medicine with anyone. Selling or giving away this medicine is dangerous and against the law. Store at room temperature between 20 and 25 degrees C (68 and 77 degrees F). Keep container tightly closed. Protect from light. Flush any unused medicines down the toilet. Do not use the medicine after the expiration date. NOTE: This sheet is a summary. It may not cover all possible information. If you have questions about this medicine, talk to your doctor, pharmacist, or health care provider.  2012, Elsevier/Gold Standard. (02/10/2008 10:01:21 AM)  RESOURCE GUIDE  Chronic Pain Problems: Contact [facility]  [phone] Patients need to be referred by their  primary care doctor.  Insufficient Money for Medicine: Contact United Way:  call "211" or [facility] [phone].  No Primary Care Doctor: - Call [facility] Connect  [phone] - can help you locate a primary care doctor that  accepts your insurance, provides certain services, etc. - Physician Referral [phone]  Agencies that provide inexpensive medical care: - [facility]  [phone] - [facility]  [phone] - [facility]  [phone] [facility]  [phone] - Planned Parenthood  [phone] [facility]  [phone]  Medicaid-accepting [facility] Providers: - [facility]- [address]  [phone], Mon-Fri 9am-7pm, Sat 9am-1pm - Celesta Gentile  [facility]- [address] [place], [address] [place]  [phone] - [facility]- [address], [address] [place]  [phone] [facility]- [address]  [phone] - Renaye Rakers- [address] [place], [address], [phone]  Only accepts [place] patients after they have their name  applied to their card  Self Pay (no insurance) in [facility]: - Sickle Cell Patients: Dr Willey Blade, [facility]  [address] [place], [phone] - [facility]- [address] [place]  [phone]       [facility]- [address]       -     [facility]- see information above (Speak to [employer] if you do not have insurance)       -  [facility]- [address] [place], [phone]       -  [facility]- [address],  [phone]       -  [facility]- [address] [phone]       -  Dr Julio Sicks-  [address], [place], [phone]       -  [facility]- [address] [phone]       -  [facility]- [address], [phone], also [address] [phone]        -    [facility]- [address] [place], [phone], 1st & 3rd Saturday   every month, 10am-1pm  1) Find a Doctor and Pay Out of Pocket Although you won't have to find out who is covered by your insurance plan, it is a good idea to ask around and get recommendations. You will then need to call the office and see if the doctor you have chosen will accept you as a new patient and what types of options they offer for patients who are self-pay. Some doctors offer discounts or will set up payment plans for their patients who do not have insurance, but you will need to ask so you aren't surprised when you get to your appointment.  2) Contact Your Local Health Department Not all health departments have doctors that can see patients for sick visits, but many do, so it is worth a call to see if yours does. If you don't know where your local health department is, you can check in your phone book. The CDC also has a tool to help you locate your state's health department, and many state websites also have listings of all of their local health departments.  3) Find a Walk-in Clinic If your illness is not likely to be very severe or complicated, you may want to try a walk in clinic. These are popping up all over the country in pharmacies, drugstores, and shopping centers. They're usually staffed by nurse practitioners or physician assistants that have been trained to treat common illnesses and complaints. They're usually fairly quick and inexpensive. However, if you have serious medical issues or chronic medical problems, these are probably not your best option  STD Testing - [facility] Department of [facility], STD Clinic, [address] [place], phone [phone] or [phone].  Monday - Friday, call for an appointment. [facility] Department of [employer], STD Clinic, [address], [place], phone [phone] or [phone].  Monday -  Friday, call for an  appointment.  Abuse/Neglect: [facility] Child Abuse Hotline 5673100014 [facility] Child Abuse Hotline [phone] (After Hours)  Emergency Shelter:  [facility] [phone]  Maternity Homes: - [facility] [phone] - [facility] [phone]  MRSA Hotline #:   385-017-3739  [facility] Resources  [facility] [place]  United Way [facility] Dept. [address]                 [address]         [address] [place] [address]  Blondell Reveal Phone:  [phone]                                  Phone:  [phone]                   Phone:  [phone]  [facility], [phone] - [phone]       -     [facility] in [place], [address]                                  [phone], [facility] Child Abuse Hotline 636-338-7738 or 662-718-2839 (After Hours)   Behavioral Health Services  Substance Abuse Resources: - [facility]  [phone] - [facility] [phone] - [facility] [place] [phone] Floydene Flock ([phone] - Residential & Outpatient Substance Abuse Program  ([phone]  Psychological Services: [facility]  [phone] Services  [phone] - [facility], 431-500-1117 [place]. [address], [place], ACCESS LINE: 845-280-1453 or 878-700-8869, [employer]za  Dental Assistance  If unable to pay or uninsured, contact:  [facility] or [facility]. to become qualified for the adult dental clinic.  Patients with Medicaid: [facility] [phone] [address], [phone] [address] [phone]  If unable to pay,  or uninsured, contact [facility] 319-718-4586) or [facility] Department [phone] in [place], [phone] in [facility]) to become qualified for the adult dental clinic  Other Low-Cost Community Dental Services: - [facility]- [address] [place], [place], [place], [address], [phone], [address]. [phone], 2nd and 4th Thursday of the month at 6:30am.  10 clients each day by appointment, can sometimes see walk-in patients if someone does not show for an appointment. [facility]- [address] [place], [place], [address], [phone] - [facility]- [address] [place], [place], [address], [phone] - [facility]- [phone] [facility]- [phone] [facility] Department- [phone]

## 2011-08-25 NOTE — ED Provider Notes (Signed)
This chart was scribed for Dione Booze, MD by Wallis Mart. The patient was seen in room STRE8/STRE8 and the patient's care was started at 12:57 PM.   CSN: [id number]  Arrival date & time 08/25/11  1223   First MD Initiated Contact with Patient 08/25/11 1250      Chief Complaint  Patient presents with  . Foot Pain    (Consider location/radiation/quality/duration/timing/severity/associated sxs/prior treatment) HPI Larry Shannon is a 36 y.o. male who presents to the Emergency Department complaining of sudden onset, persistence of constant, left foot pain and swelling onset 6 days ago.  Pt was seen in the ED when the pain started and dx'd w/ gout. Pt was rx'd oxycodone and ibuprofen w/ no relief.   Pt currently rates foot pain as 20/10, describes pain as dull, states that the pain has not increased but the swelling has decreased since yesterday. Denies fever.   Pt also states that he drinks 6 beers a night.  There are no other associated symptoms and no other alleviating or aggravating factors.    History reviewed. No pertinent past medical history.  History reviewed. No pertinent past surgical history.  No family history on file.  History  Substance Use Topics  . Smoking status: Not on file  . Smokeless tobacco: Not on file  . Alcohol Use: No      Review of Systems  Musculoskeletal:       Foot pain  All other systems reviewed and are negative.    Allergies  Review of patient's allergies indicates no known allergies.  Home Medications   Current Outpatient Rx  Name Route Sig Dispense Refill  . IBUPROFEN 600 MG PO TABS Oral Take 600 mg by mouth every 8 (eight) hours as needed. For pain    . OXYCODONE-ACETAMINOPHEN 5-325 MG PO TABS Oral Take 1-2 tablets by mouth every 4 (four) hours as needed. For pain      BP 140/82  Pulse 64  Temp(Src) 97.4 F (36.3 C) (Oral)  Resp 12  Ht 6\' 2"  (1.88 m)  Wt 250 lb (113.399 kg)  BMI 32.10 kg/m2  SpO2 98%  Physical Exam    Nursing note and vitals reviewed. Constitutional: He is oriented to person, place, and time. He appears well-developed and well-nourished. No distress.  HENT:  Head: Normocephalic and atraumatic.  Eyes: EOM are normal.  Neck: Neck supple. No tracheal deviation present.  Cardiovascular: Normal rate.   Pulmonary/Chest: Effort normal. No respiratory distress.  Musculoskeletal: Normal range of motion. He exhibits tenderness.       Left mild swelling, warmth and marked tenderness of the left first MTP joint   Neurological: He is alert and oriented to person, place, and time.  Skin: Skin is warm and dry.  Psychiatric: He has a normal mood and affect. His behavior is normal.    ED Course  Procedures (including critical care time) DIAGNOSTIC STUDIES: Oxygen Saturation is 98% on room air, normal by my interpretation.    COORDINATION OF CARE:    Labs Reviewed  URIC ACID - Abnormal; Notable for the following:    Uric Acid, Serum 9.3 (*)    All other components within normal limits   Dg Toe Great Left  08/25/2011  *RADIOLOGY REPORT*  Clinical Data: Pain and swelling, no injury  LEFT GREAT TOE  Comparison: None.  Findings: Negative for fracture.  No arthropathy or erosion is identified.  Joint spaces are normal.  There is soft tissue swelling.  IMPRESSION: No significant bony  abnormality.  Original Report Authenticated By: Camelia Phenes, M.D.     1. Gout of big toe   2. Hyperuricemia       MDM  Clinical episode of gout with podagra. It appears that he received inadequate treatment with less than full anti-inflammatory dose of NSAID and no steroid. He is concerned that his toe may be broken so an x-ray is obtained and uric acid will be obtained to aid in definite diagnosis of gout.  Uric acid does come back slightly elevated consistent with gout an x-ray is negative. He is told to discontinue ibuprofen and he is given a prescription for indomethacin, prednisone, and Percocet 7.5 mg.  He is concerned about possible recurrence after this flareup has subsided and he is advised that he should not take daily medication until he sees how often flareups occur.    I personally performed the services described in this documentation, which was scribed in my presence. The recorded information has been reviewed and considered.          Dione Booze, MD 08/25/11 [phone]

## 2011-08-25 NOTE — ED Notes (Signed)
Pt presents with L foot pain and swelling x 6 days.  Pt seen here on Friday was diagnosed with gout, given medication with no relief.  Pt reports no imaging done;  Pt reports on Sunday, pain was dull, but son accidentally stepped on foot with increase in pain.

## 2011-08-30 ENCOUNTER — Encounter (HOSPITAL_COMMUNITY): Payer: Self-pay | Admitting: Emergency Medicine

## 2011-08-30 ENCOUNTER — Emergency Department (HOSPITAL_COMMUNITY)
Admission: EM | Admit: 2011-08-30 | Discharge: 2011-08-30 | Disposition: A | Discharge status: Home / Self Care | Payer: Self-pay | Attending: Emergency Medicine | Admitting: Emergency Medicine

## 2011-08-30 DIAGNOSIS — M109 Gout, unspecified: Secondary | ICD-10-CM | POA: Insufficient documentation

## 2011-08-30 MED ORDER — TRAMADOL HCL 50 MG PO TABS
50.0000 mg | ORAL_TABLET | Freq: Once | ORAL | Status: AC
  Administered 2011-08-30: 50 mg via ORAL
  Filled 2011-08-30 (×2): qty 1

## 2011-08-30 MED ORDER — TRAMADOL HCL 50 MG PO TABS: 50.0000 mg | ORAL_TABLET | Freq: Four times a day (QID) | ORAL | Status: AC | PRN

## 2011-08-30 NOTE — ED Notes (Signed)
C/o gout in L foot x 2 weeks.  States this is 3rd visit for same.  Out of pain medication.

## 2011-08-30 NOTE — ED Provider Notes (Signed)
History     CSN: [id number]  Arrival date & time 08/30/11  1621   First MD Initiated Contact with Patient 08/30/11 1745      Chief Complaint  Patient presents with  . Gout    (Consider location/radiation/quality/duration/timing/severity/associated sxs/prior treatment) Patient is a 36 y.o. male presenting with lower extremity pain. The history is provided by the patient. No language interpreter was used.  Foot Pain This is a recurrent problem. The current episode started 1 to 4 weeks ago. The problem occurs 2 to 4 times per day. The problem has been waxing and waning. Associated symptoms include joint swelling. The symptoms are aggravated by walking. He has tried NSAIDs and oral narcotics for the symptoms. The treatment provided mild relief.  This is the patient's third visit since 08/20/11 for gout.  Ran out of pain medicines, requesting refill.  History reviewed. No pertinent past medical history.  History reviewed. No pertinent past surgical history.  No family history on file.  History  Substance Use Topics  . Smoking status: Not on file  . Smokeless tobacco: Not on file  . Alcohol Use: No      Review of Systems  Musculoskeletal: Positive for joint swelling.  All other systems reviewed and are negative.    Allergies  Review of patient's allergies indicates no known allergies.  Home Medications   Current Outpatient Rx  Name Route Sig Dispense Refill  . INDOMETHACIN 50 MG PO CAPS Oral Take 1 capsule (50 mg total) by mouth 3 (three) times daily with meals. 30 capsule 0  . OXYCODONE-ACETAMINOPHEN 5-325 MG PO TABS Oral Take 1-2 tablets by mouth every 4 (four) hours as needed. For pain    . PREDNISONE 20 MG PO TABS Oral Take 3 tablets (60 mg total) by mouth daily. 15 tablet 0    BP 124/74  Pulse 84  Temp(Src) 98.3 F (36.8 C) (Oral)  Resp 18  SpO2 98%  Physical Exam  Nursing note and vitals reviewed. Constitutional: He is oriented to person, place, and time.  He appears well-developed and well-nourished. No distress.  HENT:  Head: Normocephalic.  Eyes: Pupils are equal, round, and reactive to light.  Neck: Normal range of motion.  Cardiovascular: Normal rate, regular rhythm, normal heart sounds and intact distal pulses.   Pulmonary/Chest: Effort normal and breath sounds normal.  Abdominal: Soft.  Musculoskeletal: Normal range of motion. He exhibits tenderness.  Neurological: He is alert and oriented to person, place, and time.  Skin: Skin is warm and dry.  Psychiatric: He has a normal mood and affect. His behavior is normal. Judgment and thought content normal.    ED Course  Procedures (including critical care time)  Labs Reviewed - No data to display No results found.   No diagnosis found.  Gout of left great toe.  Discussed use of colchicine and side effect profile--patient declined.  Patient has completed steroid.  Is continuing to take the indocin. Will transition to non-narcotic pain medication (ultram).  Recommend follow-up with orthopedics for continuing pain.  [facility] contact information provided.  MDM          Jimmye Norman, NP 08/30/11 [phone]

## 2011-08-30 NOTE — Discharge Instructions (Signed)
Gout Gout is an inflammatory condition (arthritis) caused by a buildup of uric acid crystals in the joints. Uric acid is a chemical that is normally present in the blood. Under some circumstances, uric acid can form into crystals in your joints. This causes joint redness, soreness, and swelling (inflammation). Repeat attacks are common. Over time, uric acid crystals can form into masses (tophi) near a joint, causing disfigurement. Gout is treatable and often preventable. CAUSES  The disease begins with elevated levels of uric acid in the blood. Uric acid is produced by your body when it breaks down a naturally found substance called purines. This also happens when you eat certain foods such as meats and fish. Causes of an elevated uric acid level include:  Being passed down from parent to child (heredity).   Diseases that cause increased uric acid production (obesity, psoriasis, some cancers).   Excessive alcohol use.   Diet, especially diets rich in meat and seafood.   Medicines, including certain cancer-fighting drugs (chemotherapy), diuretics, and aspirin.   Chronic kidney disease. The kidneys are no longer able to remove uric acid well.   Problems with metabolism.  Conditions strongly associated with gout include:  Obesity.   High blood pressure.   High cholesterol.   Diabetes.  Not everyone with elevated uric acid levels gets gout. It is not understood why some people get gout and others do not. Surgery, joint injury, and eating too much of certain foods are some of the factors that can lead to gout. SYMPTOMS   An attack of gout comes on quickly. It causes intense pain with redness, swelling, and warmth in a joint.   Fever can occur.   Often, only one joint is involved. Certain joints are more commonly involved:   Base of the big toe.   Knee.   Ankle.   Wrist.   Finger.  Without treatment, an attack usually goes away in a few days to weeks. Between attacks, you  usually will not have symptoms, which is different from many other forms of arthritis. DIAGNOSIS  Your caregiver will suspect gout based on your symptoms and exam. Removal of fluid from the joint (arthrocentesis) is done to check for uric acid crystals. Your caregiver will give you a medicine that numbs the area (local anesthetic) and use a needle to remove joint fluid for exam. Gout is confirmed when uric acid crystals are seen in joint fluid, using a special microscope. Sometimes, blood, urine, and X-ray tests are also used. TREATMENT  There are 2 phases to gout treatment: treating the sudden onset (acute) attack and preventing attacks (prophylaxis). Treatment of an Acute Attack  Medicines are used. These include anti-inflammatory medicines or steroid medicines.   An injection of steroid medicine into the affected joint is sometimes necessary.   The painful joint is rested. Movement can worsen the arthritis.   You may use warm or cold treatments on painful joints, depending which works best for you.   Discuss the use of coffee, vitamin C, or cherries with your caregiver. These may be helpful treatment options.  Treatment to Prevent Attacks After the acute attack subsides, your caregiver may advise prophylactic medicine. These medicines either help your kidneys eliminate uric acid from your body or decrease your uric acid production. You may need to stay on these medicines for a very long time. The early phase of treatment with prophylactic medicine can be associated with an increase in acute gout attacks. For this reason, during the first few months   of treatment, your caregiver may also advise you to take medicines usually used for acute gout treatment. Be sure you understand your caregiver's directions. You should also discuss dietary treatment with your caregiver. Certain foods such as meats and fish can increase uric acid levels. Other foods such as dairy can decrease levels. Your caregiver  can give you a list of foods to avoid. HOME CARE INSTRUCTIONS   Do not take aspirin to relieve pain. This raises uric acid levels.   Only take over-the-counter or prescription medicines for pain, discomfort, or fever as directed by your caregiver.   Rest the joint as much as possible. When in bed, keep sheets and blankets off painful areas.   Keep the affected joint raised (elevated).   Use crutches if the painful joint is in your leg.   Drink enough water and fluids to keep your urine clear or pale yellow. This helps your body get rid of uric acid. Do not drink alcoholic beverages. They slow the passage of uric acid.   Follow your caregiver's dietary instructions. Pay careful attention to the amount of protein you eat. Your daily diet should emphasize fruits, vegetables, whole grains, and fat-free or low-fat milk products.   Maintain a healthy body weight.  SEEK MEDICAL CARE IF:   You have an oral temperature above 102 F (38.9 C).   You develop diarrhea, vomiting, or any side effects from medicines.   You do not feel better in 24 hours, or you are getting worse.  SEEK IMMEDIATE MEDICAL CARE IF:   Your joint becomes suddenly more tender and you have:   Chills.   An oral temperature above 102 F (38.9 C), not controlled by medicine.  MAKE SURE YOU:   Understand these instructions.   Will watch your condition.   Will get help right away if you are not doing well or get worse.  Document Released: 03/10/2000 Document Revised: 03/02/2011 Document Reviewed: 06/21/2009 [facility] Patient Information 2012 [place], [place].  FOLLOW UP WITH THE ORTHOPEDIST IF SYMPTOMS CONTINUE.  DR. Lequita Halt IS ON THE REFERRAL LIST FOR TODAY.  ANOTHER OPTION IS DR. Corliss Skains AT [facility] [phone]).  CALL FELICIA AT THE [facility] TO SEE IF YOU QUALIFY FOR ASSISTANCE.

## 2011-08-31 NOTE — ED Provider Notes (Signed)
Medical screening examination/treatment/procedure(s) were performed by non-physician practitioner and as supervising physician I was immediately available for consultation/collaboration.    Suzanne Kho R Sakshi Sermons, MD 08/31/11 1103 

## 2011-11-22 ENCOUNTER — Emergency Department (HOSPITAL_COMMUNITY): Payer: Self-pay

## 2011-11-22 ENCOUNTER — Encounter (HOSPITAL_COMMUNITY): Payer: Self-pay | Admitting: *Deleted

## 2011-11-22 ENCOUNTER — Emergency Department (HOSPITAL_COMMUNITY)
Admission: EM | Admit: 2011-11-22 | Discharge: 2011-11-22 | Disposition: A | Discharge status: Home / Self Care | Payer: Self-pay | Attending: Emergency Medicine | Admitting: Emergency Medicine

## 2011-11-22 DIAGNOSIS — R112 Nausea with vomiting, unspecified: Secondary | ICD-10-CM | POA: Insufficient documentation

## 2011-11-22 DIAGNOSIS — N390 Urinary tract infection, site not specified: Secondary | ICD-10-CM | POA: Insufficient documentation

## 2011-11-22 LAB — CBC WITH DIFFERENTIAL/PLATELET
Basophils Absolute: 0.1 10*3/uL (ref 0.0–0.1)
Eosinophils Absolute: 0 10*3/uL (ref 0.0–0.7)
Lymphocytes Relative: 25 % (ref 12–46)
Lymphs Abs: 1.7 10*3/uL (ref 0.7–4.0)
MCHC: 34.1 g/dL (ref 30.0–36.0)
MCV: 84.9 fL (ref 78.0–100.0)
Monocytes Relative: 8 % (ref 3–12)
Neutro Abs: 4.3 10*3/uL (ref 1.7–7.7)
Platelets: 198 10*3/uL (ref 150–400)
RDW: 13.5 % (ref 11.5–15.5)
WBC: 6.6 10*3/uL (ref 4.0–10.5)

## 2011-11-22 LAB — URINE MICROSCOPIC-ADD ON

## 2011-11-22 LAB — COMPREHENSIVE METABOLIC PANEL
AST: 46 U/L — ABNORMAL HIGH (ref 0–37)
Albumin: 4 g/dL (ref 3.5–5.2)
BUN: 5 mg/dL — ABNORMAL LOW (ref 6–23)
Chloride: 97 mEq/L (ref 96–112)
Creatinine, Ser: 1.02 mg/dL (ref 0.50–1.35)
Total Bilirubin: 2.1 mg/dL — ABNORMAL HIGH (ref 0.3–1.2)
Total Protein: 7.9 g/dL (ref 6.0–8.3)

## 2011-11-22 LAB — URINALYSIS, ROUTINE W REFLEX MICROSCOPIC
Glucose, UA: NEGATIVE mg/dL
Hgb urine dipstick: NEGATIVE
Protein, ur: 30 mg/dL — AB
Specific Gravity, Urine: 1.023 (ref 1.005–1.030)
pH: 6 (ref 5.0–8.0)

## 2011-11-22 MED ORDER — CIPROFLOXACIN HCL 500 MG PO TABS
500.0000 mg | ORAL_TABLET | Freq: Once | ORAL | Status: AC
  Administered 2011-11-22: 500 mg via ORAL
  Filled 2011-11-22: qty 1

## 2011-11-22 MED ORDER — ACETAMINOPHEN 325 MG PO TABS
650.0000 mg | ORAL_TABLET | Freq: Once | ORAL | Status: AC
Start: 2011-11-22 — End: 2011-11-22
  Administered 2011-11-22: 650 mg via ORAL
  Filled 2011-11-22: qty 2

## 2011-11-22 MED ORDER — SODIUM CHLORIDE 0.9 % IV BOLUS (SEPSIS)
1000.0000 mL | Freq: Once | INTRAVENOUS | Status: AC
  Administered 2011-11-22: 1000 mL via INTRAVENOUS

## 2011-11-22 MED ORDER — CIPROFLOXACIN HCL 500 MG PO TABS: 500.0000 mg | ORAL_TABLET | Freq: Two times a day (BID) | ORAL | Status: AC

## 2011-11-22 NOTE — ED Provider Notes (Signed)
History     CSN: [id number]  Arrival date & time 11/22/11  7829   First MD Initiated Contact with Patient 11/22/11 2141      Chief Complaint  Patient presents with  . Abdominal Pain  . Nausea  . Emesis  . Diarrhea  . Fever  . Generalized Body Aches    (Consider location/radiation/quality/duration/timing/severity/associated sxs/prior treatment) Patient is a 36 y.o. male presenting with abdominal pain, vomiting, diarrhea, and fever. The history is provided by the patient.  Abdominal Pain The primary symptoms of the illness include abdominal pain, fever, vomiting and diarrhea.  Emesis  Associated symptoms include abdominal pain, diarrhea and a fever.  Diarrhea The primary symptoms include fever, abdominal pain, vomiting and diarrhea.  Fever Primary symptoms of the febrile illness include fever, abdominal pain, vomiting and diarrhea.   Patient here with 7 days of sickness consisting of diarrhea with transient vomiting which has since resolved. Denies any abdominal pain. Patient has had fever x24 hours without cough or congestion. No urinary symptoms. Patient has not been taking anything for his fever and nothing makes her symptoms better worse. Denies any rashes or sick exposures. No prior history of same. Denies any headache or photophobia. History reviewed. No pertinent past medical history.  History reviewed. No pertinent past surgical history.  History reviewed. No pertinent family history.  History  Substance Use Topics  . Smoking status: Never Smoker   . Smokeless tobacco: Not on file  . Alcohol Use: Yes     occ      Review of Systems  Constitutional: Positive for fever.  Gastrointestinal: Positive for vomiting, abdominal pain and diarrhea.  All other systems reviewed and are negative.    Allergies  Review of patient's allergies indicates no known allergies.  Home Medications   Current Outpatient Rx  Name Route Sig Dispense Refill  . INDOMETHACIN 50 MG  PO CAPS Oral Take 1 capsule (50 mg total) by mouth 3 (three) times daily with meals. 30 capsule 0  . PREDNISONE 20 MG PO TABS Oral Take 3 tablets (60 mg total) by mouth daily. 15 tablet 0    BP 117/77  Pulse 106  Temp 101.2 F (38.4 C) (Oral)  Resp 16  SpO2 98%  Physical Exam  Nursing note and vitals reviewed. Constitutional: He is oriented to person, place, and time. He appears well-developed and well-nourished.  Non-toxic appearance. No distress.  HENT:  Head: Normocephalic and atraumatic.  Eyes: Conjunctivae, EOM and lids are normal. Pupils are equal, round, and reactive to light.  Neck: Normal range of motion. Neck supple. No tracheal deviation present. No mass present.  Cardiovascular: Regular rhythm and normal heart sounds.  Tachycardia present.  Exam reveals no gallop.   No murmur heard. Pulmonary/Chest: Effort normal and breath sounds normal. No stridor. No respiratory distress. He has no decreased breath sounds. He has no wheezes. He has no rhonchi. He has no rales.  Abdominal: Soft. Normal appearance and bowel sounds are normal. He exhibits no distension. There is no tenderness. There is no rebound and no CVA tenderness.  Musculoskeletal: Normal range of motion. He exhibits no edema and no tenderness.  Neurological: He is alert and oriented to person, place, and time. He has normal strength. No cranial nerve deficit or sensory deficit. GCS eye subscore is 4. GCS verbal subscore is 5. GCS motor subscore is 6.  Skin: Skin is warm and dry. No abrasion and no rash noted.  Psychiatric: He has a normal mood and affect.  His speech is normal and behavior is normal.    ED Course  Procedures (including critical care time)  Labs Reviewed  CBC WITH DIFFERENTIAL - Abnormal; Notable for the following:    RBC 5.88 (*)     All other components within normal limits  COMPREHENSIVE METABOLIC PANEL - Abnormal; Notable for the following:    Sodium 134 (*)     BUN 5 (*)     AST 46 (*)      ALT 87 (*)     Total Bilirubin 2.1 (*)     All other components within normal limits   No results found.   No diagnosis found.    MDM  Pt to be tx for likely uti        Toy Baker, MD 11/22/11 2344

## 2011-11-22 NOTE — ED Notes (Addendum)
Pt reports week hx of abd pain, n/v/d, headache, fatigue, fevers, and bodyaches. Abd pain is generalized.

## 2011-11-23 LAB — URINE CULTURE: Colony Count: 3000

## 2012-02-02 ENCOUNTER — Emergency Department (HOSPITAL_COMMUNITY)
Admission: EM | Admit: 2012-02-02 | Discharge: 2012-02-02 | Disposition: A | Discharge status: Home / Self Care | Payer: Self-pay | Attending: Emergency Medicine | Admitting: Emergency Medicine

## 2012-02-02 ENCOUNTER — Encounter (HOSPITAL_COMMUNITY): Payer: Self-pay | Admitting: Emergency Medicine

## 2012-02-02 DIAGNOSIS — M109 Gout, unspecified: Secondary | ICD-10-CM

## 2012-02-02 DIAGNOSIS — R262 Difficulty in walking, not elsewhere classified: Secondary | ICD-10-CM | POA: Insufficient documentation

## 2012-02-02 DIAGNOSIS — R21 Rash and other nonspecific skin eruption: Secondary | ICD-10-CM | POA: Insufficient documentation

## 2012-02-02 HISTORY — DX: Gout, unspecified: M10.9

## 2012-02-02 MED ORDER — INDOMETHACIN 25 MG PO CAPS: 25.0000 mg | ORAL_CAPSULE | Freq: Three times a day (TID) | ORAL | Status: DC | PRN

## 2012-02-02 NOTE — ED Notes (Signed)
Gout in rt toe hurts to walk has hx red swollen and tender

## 2012-02-03 NOTE — ED Provider Notes (Signed)
History     CSN: [id number]  Arrival date & time 02/02/12  1459   First MD Initiated Contact with Patient 02/02/12 1602      Chief Complaint  Patient presents with  . Toe Pain    (Consider location/radiation/quality/duration/timing/severity/associated sxs/prior treatment) HPI Patient c.o. Of pain right great toe for several days.  Pain c.w. Prior gout.  Pain is sever, throbbing, present right mcp joint with some swelling worsened with palpation and weight bearing not improved with ibuprofen.  States previously resolved with indocin.  Past Medical History  Diagnosis Date  . Gout     History reviewed. No pertinent past surgical history.  No family history on file.  History  Substance Use Topics  . Smoking status: Never Smoker   . Smokeless tobacco: Not on file  . Alcohol Use: Yes     Comment: occ      Review of Systems  Constitutional: Negative for fever.  Respiratory: Negative for cough and shortness of breath.   Cardiovascular: Negative for chest pain.  Gastrointestinal: Negative for vomiting.  Musculoskeletal: Positive for joint swelling and gait problem.  Skin: Positive for rash. Negative for wound.  Neurological: Negative for headaches.    Allergies  Review of patient's allergies indicates no known allergies.  Home Medications   Current Outpatient Rx  Name  Route  Sig  Dispense  Refill  . IBUPROFEN 200 MG PO TABS   Oral   Take 400 mg by mouth every 6 (six) hours as needed. For pain         . INDOMETHACIN 25 MG PO CAPS   Oral   Take 1 capsule (25 mg total) by mouth 3 (three) times daily as needed.   30 capsule   0     BP 146/96  Pulse 77  Temp 98.2 F (36.8 C)  Resp 16  SpO2 98%  Physical Exam  Nursing note and vitals reviewed. Constitutional: He is oriented to person, place, and time. He appears well-developed and well-nourished.  HENT:  Head: Normocephalic and atraumatic.  Neck: Normal range of motion.  Cardiovascular: Normal rate.     Pulmonary/Chest: Effort normal.  Abdominal: Soft.  Musculoskeletal:       Right mtp joint with mild diffuse swelling, warmth, and tenderness.  dp intact.  No proximal erythema, redness or warmth.   Neurological: He is alert and oriented to person, place, and time.  Skin: Skin is warm and dry.  Psychiatric: He has a normal mood and affect.    ED Course  Procedures (including critical care time)  Labs Reviewed - No data to display No results found.   1. Gout       MDM       Hilario Quarry, MD 02/03/12 1325

## 2013-05-29 ENCOUNTER — Emergency Department (HOSPITAL_COMMUNITY): Payer: Self-pay

## 2013-05-29 ENCOUNTER — Encounter (HOSPITAL_COMMUNITY): Payer: Self-pay | Admitting: Emergency Medicine

## 2013-05-29 ENCOUNTER — Emergency Department (HOSPITAL_COMMUNITY)
Admission: EM | Admit: 2013-05-29 | Discharge: 2013-05-29 | Discharge status: Against Medical Advice | Payer: Self-pay | Attending: Emergency Medicine | Admitting: Emergency Medicine

## 2013-05-29 DIAGNOSIS — R42 Dizziness and giddiness: Secondary | ICD-10-CM | POA: Insufficient documentation

## 2013-05-29 DIAGNOSIS — R0602 Shortness of breath: Secondary | ICD-10-CM | POA: Insufficient documentation

## 2013-05-29 DIAGNOSIS — R079 Chest pain, unspecified: Secondary | ICD-10-CM | POA: Insufficient documentation

## 2013-05-29 DIAGNOSIS — R209 Unspecified disturbances of skin sensation: Secondary | ICD-10-CM | POA: Insufficient documentation

## 2013-05-29 LAB — BASIC METABOLIC PANEL
BUN: 7 mg/dL (ref 6–23)
CALCIUM: 9.6 mg/dL (ref 8.4–10.5)
CO2: 25 meq/L (ref 19–32)
CREATININE: 0.87 mg/dL (ref 0.50–1.35)
Chloride: 99 mEq/L (ref 96–112)
Glucose, Bld: 89 mg/dL (ref 70–99)
Potassium: 3.9 mEq/L (ref 3.7–5.3)
Sodium: 139 mEq/L (ref 137–147)

## 2013-05-29 LAB — CBC
HCT: 47.6 % (ref 39.0–52.0)
Hemoglobin: 16 g/dL (ref 13.0–17.0)
MCH: 29.1 pg (ref 26.0–34.0)
MCHC: 33.6 g/dL (ref 30.0–36.0)
MCV: 86.5 fL (ref 78.0–100.0)
PLATELETS: 208 10*3/uL (ref 150–400)
RBC: 5.5 MIL/uL (ref 4.22–5.81)
RDW: 13 % (ref 11.5–15.5)
WBC: 5 10*3/uL (ref 4.0–10.5)

## 2013-05-29 LAB — I-STAT TROPONIN, ED: TROPONIN I, POC: 0 ng/mL (ref 0.00–0.08)

## 2013-05-29 NOTE — ED Notes (Signed)
Pt having chest pain x 1 week. Intermittent associated with SOB and finger numbness. sts also lightheaded.

## 2013-05-29 NOTE — ED Notes (Signed)
Pt did not answer.

## 2013-05-29 NOTE — ED Notes (Signed)
No answer x3

## 2013-05-29 NOTE — ED Notes (Signed)
Pt did not answer when name was called x2

## 2013-10-23 ENCOUNTER — Emergency Department (HOSPITAL_COMMUNITY)
Admission: EM | Admit: 2013-10-23 | Discharge: 2013-10-23 | Disposition: A | Discharge status: Home / Self Care | Payer: Self-pay | Attending: Emergency Medicine | Admitting: Emergency Medicine

## 2013-10-23 ENCOUNTER — Emergency Department (HOSPITAL_COMMUNITY): Payer: Self-pay

## 2013-10-23 ENCOUNTER — Encounter (HOSPITAL_COMMUNITY): Payer: Self-pay | Admitting: Emergency Medicine

## 2013-10-23 DIAGNOSIS — M79671 Pain in right foot: Secondary | ICD-10-CM

## 2013-10-23 DIAGNOSIS — M79609 Pain in unspecified limb: Secondary | ICD-10-CM | POA: Insufficient documentation

## 2013-10-23 DIAGNOSIS — M109 Gout, unspecified: Secondary | ICD-10-CM | POA: Insufficient documentation

## 2013-10-23 DIAGNOSIS — M25473 Effusion, unspecified ankle: Secondary | ICD-10-CM | POA: Insufficient documentation

## 2013-10-23 DIAGNOSIS — M25476 Effusion, unspecified foot: Secondary | ICD-10-CM | POA: Insufficient documentation

## 2013-10-23 MED ORDER — NAPROXEN 500 MG PO TABS: 500.0000 mg | ORAL_TABLET | Freq: Two times a day (BID) | ORAL | Status: DC

## 2013-10-23 NOTE — Discharge Instructions (Signed)
Ankle Pain °Ankle pain is a common symptom. The bones, cartilage, tendons, and muscles of the ankle joint perform a lot of work each day. The ankle joint holds your body weight and allows you to move around. Ankle pain can occur on either side or back of 1 or both ankles. Ankle pain may be sharp and burning or dull and aching. There may be tenderness, stiffness, redness, or warmth around the ankle. The pain occurs more often when a person walks or puts pressure on the ankle. °CAUSES  °There are many reasons ankle pain can develop. It is important to work with your caregiver to identify the cause since many conditions can impact the bones, cartilage, muscles, and tendons. Causes for ankle pain include: °· Injury, including a break (fracture), sprain, or strain often due to a fall, sports, or a high-impact activity. °· Swelling (inflammation) of a tendon (tendonitis). °· Achilles tendon rupture. °· Ankle instability after repeated sprains and strains. °· Poor foot alignment. °· Pressure on a nerve (tarsal tunnel syndrome). °· Arthritis in the ankle or the lining of the ankle. °· Crystal formation in the ankle (gout or pseudogout). °DIAGNOSIS  °A diagnosis is based on your medical history, your symptoms, results of your physical exam, and results of diagnostic tests. Diagnostic tests may include X-ray exams or a computerized magnetic scan (magnetic resonance imaging, MRI). °TREATMENT  °Treatment will depend on the cause of your ankle pain and may include: °· Keeping pressure off the ankle and limiting activities. °· Using crutches or other walking support (a cane or brace). °· Using rest, ice, compression, and elevation. °· Participating in physical therapy or home exercises. °· Wearing shoe inserts or special shoes. °· Losing weight. °· Taking medications to reduce pain or swelling or receiving an injection. °· Undergoing surgery. °HOME CARE INSTRUCTIONS  °· Only take over-the-counter or prescription medicines for  pain, discomfort, or fever as directed by your caregiver. °· Put ice on the injured area. °¨ Put ice in a plastic bag. °¨ Place a towel between your skin and the bag. °¨ Leave the ice on for 15-20 minutes at a time, 03-04 times a day. °· Keep your leg raised (elevated) when possible to lessen swelling. °· Avoid activities that cause ankle pain. °· Follow specific exercises as directed by your caregiver. °· Record how often you have ankle pain, the location of the pain, and what it feels like. This information may be helpful to you and your caregiver. °· Ask your caregiver about returning to work or sports and whether you should drive. °· Follow up with your caregiver for further examination, therapy, or testing as directed. °SEEK MEDICAL CARE IF:  °· Pain or swelling continues or worsens beyond 1 week. °· You have an oral temperature above 102° F (38.9° C). °· You are feeling unwell or have chills. °· You are having an increasingly difficult time with walking. °· You have loss of sensation or other new symptoms. °· You have questions or concerns. °MAKE SURE YOU:  °· Understand these instructions. °· Will watch your condition. °· Will get help right away if you are not doing well or get worse. °Document Released: 08/31/2009 Document Revised: 06/05/2011 Document Reviewed: 08/31/2009 °ExitCare® Patient Information ©2015 [employer]. This information is not intended to replace advice given to you by your health care provider. Make sure you discuss any questions you have with your health care provider. ° ° °Emergency Department Resource Guide °1) Find a Doctor and Pay Out of   Pocket °Although you won't have to find out who is covered by your insurance plan, it is a good idea to ask around and get recommendations. You will then need to call the office and see if the doctor you have chosen will accept you as a new patient and what types of options they offer for patients who are self-pay. Some doctors offer discounts or  will set up payment plans for their patients who do not have insurance, but you will need to ask so you aren't surprised when you get to your appointment. ° °2) Contact Your Local Health Department °Not all health departments have doctors that can see patients for sick visits, but many do, so it is worth a call to see if yours does. If you don't know where your local health department is, you can check in your phone book. The CDC also has a tool to help you locate your state's health department, and many state websites also have listings of all of their local health departments. ° °3) Find a Walk-in Clinic °If your illness is not likely to be very severe or complicated, you may want to try a walk in clinic. These are popping up all over the country in pharmacies, drugstores, and shopping centers. They're usually staffed by nurse practitioners or physician assistants that have been trained to treat common illnesses and complaints. They're usually fairly quick and inexpensive. However, if you have serious medical issues or chronic medical problems, these are probably not your best option. ° °No Primary Care Doctor: °- Call [facility] at  832-8000 - they can help you locate a primary care doctor that  accepts your insurance, provides certain services, etc. °- Physician Referral Service- 1-800-533-3463 ° °Chronic Pain Problems: °Organization         Address  Phone   Notes  °[facility]  (336) 297-2271 Patients need to be referred by their primary care doctor.  ° °Medication Assistance: °Organization         Address  Phone   Notes  °[place] [facility] [address] °[place], [place] [address] (336) 641-8030 --Must be a resident of [place] °-- Must have NO insurance coverage whatsoever (no Medicaid/ Medicare, etc.) °-- The pt. MUST have a primary care doctor that directs their care regularly and follows them in the community °  °[facility]  (866)  331-1348   °[employer]  (888) 892-1162   ° °Agencies that provide inexpensive medical care: °Organization         Address  Phone   Notes  °[facility]  (336) 832-8035   °[facility]    (336) 832-7272   °[facility] [address] °[place], [place] [address] (336) 832-4777   °[facility] [address], °[place] (336) 271-4999   °[facility]    (336) 373-0678   °[facility]    (336) 272-1050   °[facility] ° [address], [place] Phone:  (336) 832-4444, Fax:  (336) 832-4440 Hours of Operation:  9 am - 6 pm, M-F.  Also accepts Medicaid/Medicare and self-pay.  °[facility] ° [address], [place] Phone: (336) 832-3150, Fax: (336) 832-3151. Hours of Operation:  8:30 am - 5:30 pm, M-F.  Also accepts Medicaid and self-pay.  °[facility] [place] [address], [place] Phone: (336) 878-6027   °[facility] [address], [place], [place] (336)723-1848, Ext. 123 Mondays & Thursdays: 7-9 AM.  First 15 patients are seen on a first come, first serve basis. °  ° °Medicaid-accepting [place] Providers: ° °Organization         Address  Phone   Notes  °[facility] [address], [place] (336) 641-2100 Also accepts self-pay patients.  °[facility] [address], [place] ° (336) 856-9996   °[facility] [address] [place] [address], [place] (336) 288-8857   °[facility] [address] [place] [address], [place] (336) 299-7000   °Veita Bland [address], [place]  ° (336) 373-1557 Only accepts [place] Access Medicaid patients after they have their name applied to their card.  ° °Self-Pay (no insurance) in [place]: ° °Organization         Address  Phone   Notes  °Sickle Cell Patients, [facility] [address], [place] (336) 832-1970   °[facility] [address], [place] (336) 832-4400   °[facility] ° [address], [place] (336) 992-4800   °[facility]/Dr. Osei-Bonsu ° [address] [place] [address], [place] or [address], [place] (336) 841-8500 Phone number for both [place] and [place] locations is the same.  °[facility] [address], [place] (336) 299-0000   °[facility] [address] [place] [address], [place] or [address] (336) 852-7530 °(336) 878-2260   °[facility] [address], [place] (336) 350-1642, phone; (336) 294-5005, fax Sees patients 1st and 3rd Saturday of every month.  Must not qualify for public or private insurance (i.e. Medicaid, Medicare, [place] Health Choice, Veterans' Benefits) • Household income should be no more than 200% of the poverty level •The clinic cannot treat you if you are pregnant or think you are pregnant • Sexually transmitted diseases are not treated at the clinic.  ° ° °Dental Care: °Organization         Address  Phone  Notes  °[facility] [address], [place] (336) 641-6152 Accepts children up to age 21 who are enrolled in Medicaid or [place] Health Choice; pregnant women with a Medicaid card; and children who have applied for Medicaid or [place] Health Choice, but were declined, whose parents can pay a reduced fee at time of service.  °[facility] [place]  [address], [place] (336) 641-7733 Accepts children up to age 21 who are enrolled in Medicaid or [place] Health Choice; pregnant women with a Medicaid card; and children who have applied for Medicaid or  Health Choice, but were declined, whose parents can pay a reduced fee at time of service.  °[facility] ° [address], [place] (336)  641-4533 Patients are seen by appointment only. Walk-ins are not accepted. [facility] will see patients 18 years of age and older. °Monday - Tuesday (8am-5pm) °Most Wednesdays (8:30-5pm) °$30 per visit, cash only  °[facility] ° [address], [place] (336) 641-4533 Patients are seen by appointment only. Walk-ins are not accepted. [facility] will see patients 18 years of age and older. °One Wednesday Evening (Monthly: Volunteer Based).  $30 per visit, cash only  °  [facility]  (919) 537-3737 for adults; Children under age 4, call Graduate Pediatric Dentistry at (919) [phone]. Children aged 4-14, please call (919) [phone] to request a pediatric application. ° Dental services are provided in all areas of dental care including fillings, crowns and bridges, complete and partial dentures, implants, gum treatment, root canals, and extractions. Preventive care is also provided. Treatment is provided to both adults and children. °Patients are selected via a lottery and there is often a waiting list. °  °[facility] [address], °[place] ° (336) 763-8833 [email or web address] °  °[facility] [address], [place], [place] (336)723-1848, Ext. 123 Second and Fourth Thursday of each month, opens at 6:30 AM; Clinic ends at 9 AM.  Patients are seen on a first-come first-served basis, and a limited number are seen during each clinic.  ° °[facility] ° [address], [place], [place] (336) 723-7904   Eligibility Requirements °You must have lived in [place], [place], or [place] counties for at least the last three months. °  You cannot be eligible for state or federal sponsored healthcare insurance, including Veterans Administration, Medicaid, or Medicare. °  You generally cannot be eligible for healthcare insurance through your employer.  °  How to apply: °Eligibility screenings are held every Tuesday and Wednesday afternoon  from 1:00 pm until 4:00 pm. You do not need an appointment for the interview!  °[facility] [address], [place], [place] 336-631-2330   °[facility]  336-342-8273   °[place] [facility]  336-703-3100   °[facility]  336-570-6415   ° °Behavioral Health Resources in the Community: °Intensive Outpatient Programs °Organization         Address  Phone  Notes  °[facility] [address], [place], [place] 336-878-6098   °[facility] [address], [place], [place] 336-832-9800   °[facility] [address], [place], [place] ° 336-882-2125   °[facility] [address],  °[place], [place] 1-800-853-5163 or 336-641-4981   °Substance Abuse Resources °Organization         Address  Phone  Notes  °[facility]  336-882-2125   °[facility]  336-784-9470   °[facility]  336-285-9073   °[facility]  336-845-3988   °Residential & Outpatient Substance Abuse Program  1-800-659-3381   °Psychological Services °Organization         Address  Phone  Notes  °[facility]  336- 832-9600   °[facility]  336- 378-7881   °[facility] [address], [place] 1-800-853-5163 or 336-641-4981   ° °Mobile Crisis Teams °Organization         Address  Phone  Notes  °[facility], Mobile Crisis Care Unit  1-877-626-1772   °Assertive °[facility] ° [address] [place], [place] 336-834-9664   °Sharon DeEsch [address] °[place] 336-554-5454   ° °Self-Help/Support Groups °Organization         Address  Phone             Notes  °[facility] [place] - variety of support groups  336- 373-1402 Call for more information  °Narcotics Anonymous (NA), [facility] [address], °[place]  2 meetings at this location  ° °Residential Treatment  Programs °Organization           Address  Phone  Notes  °[facility] [address],    °[place]  1-866-801-8205   °[facility] ° [address], [place], [place] 704-293-8524   °[facility] [address], [place] 336-845-3988 Admissions: 8am-3pm M-F  °[facility] [address]    °[place], [place] 336-841-1104   °[facility] [address], [place], [place] 336-379-7146   °[facility] [address]  °[place], [place] 336-285-9073   °[facility] Intensive Outpatient [address], [place], [place] 336-852-3033   °[facility] [address]  °[place], [place] 1-877-615-2722 or 336-784-9470   °[facility] [address] [place], [place] 336-227-7417 Accepts Medicaid  °[facility] [address]  °[place] 1-800-659-3381 Substance Abuse/Addiction Treatment  ° °[place] Behavioral Health Resources °Organization         Address  Phone  Notes  °[facility]  (888) 581-9988   °Julie Brannon, PhD [address] [place], [place]   (336) 349-5553 or (336) 951-0000   °[facility]   [address] °[place], [place] (336) 349-4454   °[facility] [address], [place], [place] (336) 342-8316 Insurance/Medicaid/sponsorship through [facility]  °[facility] [address]                                    [place], [place] (336) 342-8316 Therapy/tele-psych/case  °[facility] [address]  ° [place],  (336) 349-2233    °Dr. Arfeen  (336) 349-4544   °[facility] [place]  [employer] [place] [facility] 1) [address], [place] °2) [address], [place] °3)  [address], [place] (336) 349-3220 °(336) 342-7768 ° °(336) 342-8140   °[place] [facility] (336) 342-1394 or (336) 342-3537 (After Hours)    ° ° ° °

## 2013-10-23 NOTE — ED Notes (Signed)
Pt c/o right foot pain, states he was walking last week to mailbox and ankle kind of twisted. States it is hard to walk on now.

## 2013-10-23 NOTE — ED Provider Notes (Signed)
CSN: [id number]     Arrival date & time 10/23/13  1331 History  This chart was scribed for Larry Piedra PA-C working with No att. providers found by Ashley Cherry, ED scribe. This patient was seen in room WTR7/WTR7 and the patient's care was started at 3:03 PM.   First MD Initiated Contact with Patient 10/23/13 1411     Chief Complaint  Patient presents with  . Foot Pain    right   The history is provided by the patient and medical records. No language interpreter was used.   HPI Comments: Larry Shannon is a 38 y.o. male w/ hx of gout presents to the Emergency Department complaining of constant, severe, lateral right foot pain w/ swelling, onset last week. The swelling has been worse since last night. Pt is unable to put his foot into his slipper due swelling. He is unsure of injury. Pt reports having gait problems. The pain is described as 10/10 with walking and 0/10 while sitting. He applied ice last night and denies trying anything else.  Denies any prior complications to his feet. He works as a Education administrator. Denies numbness, redness, and tingling. Denies fever, nausea, vomiting, and chills. Nothing seems to help. Denies hx of peptic ulceration. He does not have a PCP.   Past Medical History  Diagnosis Date  . Gout    History reviewed. No pertinent past surgical history. No family history on file. History  Substance Use Topics  . Smoking status: Never Smoker   . Smokeless tobacco: Not on file  . Alcohol Use: Yes     Comment: occ    Review of Systems  Constitutional: Negative for fever, chills and fatigue.  Gastrointestinal: Negative for nausea and vomiting.  Musculoskeletal: Positive for arthralgias, gait problem, joint swelling and myalgias.  All other systems reviewed and are negative.     Allergies  Review of patient's allergies indicates no known allergies.  Home Medications   Prior to Admission medications   Medication Sig Start Date End Date Taking? Authorizing  Provider  naproxen (NAPROSYN) 500 MG tablet Take 1 tablet (500 mg total) by mouth 2 (two) times daily. 10/23/13   Tearia Gibbs A Forcucci, PA-C   BP 124/87  Pulse 83  Temp(Src) 97.8 F (36.6 C) (Oral)  Resp 2  SpO2 99% Physical Exam  Nursing note and vitals reviewed. Constitutional: He is oriented to person, place, and time. He appears well-developed and well-nourished.  HENT:  Head: Normocephalic.  Mouth/Throat: Oropharynx is clear and moist. No oropharyngeal exudate.  Eyes: Conjunctivae are normal. Pupils are equal, round, and reactive to light. No scleral icterus.  Neck: Normal range of motion. Neck supple. No JVD present. No thyromegaly present.  Cardiovascular: Normal rate, regular rhythm, normal heart sounds and intact distal pulses.  Exam reveals no gallop and no friction rub.   No murmur heard. Pulses:      Dorsalis pedis pulses are 2+ on the right side, and 2+ on the left side.       Posterior tibial pulses are 2+ on the right side, and 2+ on the left side.  Pulmonary/Chest: Effort normal and breath sounds normal. No respiratory distress. He has no wheezes. He has no rales.  Musculoskeletal: Normal range of motion. He exhibits tenderness.       Right ankle: He exhibits swelling. He exhibits normal range of motion, no ecchymosis, no deformity, no laceration and normal pulse. Tenderness. Head of 5th metatarsal tenderness found. No lateral malleolus, no medial malleolus, no AITFL,  no CF ligament, no posterior TFL and no proximal fibula tenderness found. Achilles tendon normal.  No calf tenderness   Lymphadenopathy:    He has no cervical adenopathy.  Neurological: He is alert and oriented to person, place, and time.  Skin: Skin is warm and dry. He is not diaphoretic.  Psychiatric: He has a normal mood and affect. His behavior is normal.    ED Course  Procedures (including critical care time) DIAGNOSTIC STUDIES: Oxygen Saturation is 99% on room air, normal by my interpretation.     COORDINATION OF CARE:  3:12 PM Discussed course of care with pt which includes naproxen bid w/ food and a foot splint. Advised pt to elevate foot as much as possible, move toes often, and to apply ice.  Pt understands and agrees.    Labs Review Labs Reviewed - No data to display  Imaging Review Dg Foot Complete Right  10/23/2013   CLINICAL DATA:  Right foot pain, no injury.  History of gout.  EXAM: RIGHT FOOT COMPLETE - 3+ VIEW  COMPARISON:  None.  FINDINGS: There is no evidence of fracture or dislocation. Slight cortical irregularity of the first metatarsal head medially. There is no evidence of arthropathy. Medial forefoot soft tissue swelling without subcutaneous gas or radiopaque foreign bodies.  IMPRESSION: Slight cortical irregularity of the medial first metatarsal head, which could reflect erosion/gout with overlying soft tissue swelling.  No acute fracture deformity or dislocation.   Electronically Signed   By: Awilda Metroourtnay  Bloomer   On: 10/23/2013 14:06     EKG Interpretation None      MDM   Final diagnoses:  Right foot pain   Patient presents to the ED with foot pain x 5 days.  Physical exam reveals some swelling and tenderness over the base of the 5th metatarsal.  Plain film xrays reveal no abnormalities at this time.  Patient will be given ASO brace here and was told to use ice, elevation as needed for swelling.  Will give the patient a prescription for naproxen BID AC for pain.  Ddx includes [place] and gout.  Patient will be covered for both with Naproxen.  He was told to return for septic joint symptoms.  Patient states understanding and agreement at this time.  He is stable for discharge.  Patient was given resource list here in the ED and was asked to follow-up with a PCP of his choosing.  I personally performed the services described in this documentation, which was scribed in my presence. The recorded information has been reviewed and is accurate.   Eben Burowourtney A Forcucci,  PA-C 10/23/13 [phone]

## 2013-10-24 NOTE — ED Provider Notes (Signed)
Medical screening examination/treatment/procedure(s) were performed by non-physician practitioner and as supervising physician I was immediately available for consultation/collaboration.   EKG Interpretation None       Hurman HornJohn M Trystin Terhune, MD 10/24/13 2052

## 2014-12-06 ENCOUNTER — Emergency Department (HOSPITAL_COMMUNITY): Payer: Self-pay

## 2014-12-06 ENCOUNTER — Emergency Department (HOSPITAL_COMMUNITY)
Admission: EM | Admit: 2014-12-06 | Discharge: 2014-12-06 | Disposition: A | Discharge status: Home / Self Care | Payer: Self-pay | Attending: Emergency Medicine | Admitting: Emergency Medicine

## 2014-12-06 ENCOUNTER — Encounter (HOSPITAL_COMMUNITY): Payer: Self-pay | Admitting: Emergency Medicine

## 2014-12-06 DIAGNOSIS — Z8739 Personal history of other diseases of the musculoskeletal system and connective tissue: Secondary | ICD-10-CM | POA: Insufficient documentation

## 2014-12-06 DIAGNOSIS — M25539 Pain in unspecified wrist: Secondary | ICD-10-CM

## 2014-12-06 DIAGNOSIS — M25532 Pain in left wrist: Secondary | ICD-10-CM | POA: Insufficient documentation

## 2014-12-06 MED ORDER — DICLOFENAC SODIUM 75 MG PO TBEC: 75.0000 mg | DELAYED_RELEASE_TABLET | Freq: Two times a day (BID) | ORAL | Status: DC

## 2014-12-06 NOTE — ED Provider Notes (Signed)
CSN: [id number]     Arrival date & time 12/06/14  1910 History   First MD Initiated Contact with Patient 12/06/14 1940     Chief Complaint  Patient presents with  . Wrist Pain     (Consider location/radiation/quality/duration/timing/severity/associated sxs/prior Treatment) Patient is a 39 y.o. male presenting with wrist pain. The history is provided by the patient.  Wrist Pain This is a new problem. The current episode started 1 to 4 weeks ago. The problem occurs constantly. The problem has been gradually worsening. He has tried nothing for the symptoms.   Larry Shannon is a 39 y.o. male who presents to the ED with left wrist pain that started approximately one month ago. He reports having used Aleve for one dose with mild improvement.  He is left hand dominant. 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   Past Medical History  Diagnosis Date  . Gout    History reviewed. No pertinent past surgical history. No family history on file. Social History  Substance Use Topics  . Smoking status: Never Smoker   . Smokeless tobacco: None  . Alcohol Use: Yes     Comment: occ    Review of Systems Negative except as stated in HPI   Allergies  Review of patient's allergies indicates no known allergies.  Home Medications   Prior to Admission medications   Medication Sig Start Date End Date Taking? Authorizing Provider  diclofenac (VOLTAREN) 75 MG EC tablet Take 1 tablet (75 mg total) by mouth 2 (two) times daily. 12/06/14   Hope Orlene Och, NP   BP 134/78 mmHg  Pulse 72  Temp(Src) 98.1 F (36.7 C) (Oral)  Resp 18  Ht 6\' 2"  (1.88 m)  Wt 261 lb (118.389 kg)  BMI 33.50 kg/m2  SpO2 99% Physical Exam  Constitutional: He is oriented to person, place, and time. He appears well-developed and well-nourished.  HENT:  Head: Normocephalic.  Eyes: EOM are normal.  Neck: Neck supple.  Cardiovascular: Normal  rate.   Pulmonary/Chest: Effort normal.  Musculoskeletal: Normal range of motion.       Left wrist: He exhibits tenderness. He exhibits normal range of motion, no crepitus, no deformity and no laceration. Swelling: minimal.  Radial pulses 2+, adequate circulation. Equal grips  Neurological: He is alert and oriented to person, place, and time. No cranial nerve deficit.  Skin: Skin is warm and dry.  Psychiatric: He has a normal mood and affect. His behavior is normal.  Nursing note and vitals reviewed.   ED Course  Procedures (including critical care time) Labs Review Labs Reviewed - No data to display  Imaging Review Dg Wrist Complete Left  12/06/2014   CLINICAL DATA:  Medial LEFT wrist pain.  No injury.  EXAM: LEFT WRIST - COMPLETE 3+ VIEW  COMPARISON:  None.  FINDINGS: There is no evidence of fracture or dislocation. There is no evidence of arthropathy or other focal bone abnormality. Soft tissues are unremarkable.  IMPRESSION: Negative.   Electronically Signed   By: Andreas Newport M.D.   On: 12/06/2014 20:33    MDM  39 y.o. male with left wrist pain x 1 month that has gotten worse over the past few days. Stable for d/c without focal neuro deficits. Wrist splint applied, ice, elevation and NSAIDS. Follow up with hand if symptoms persist. Discussed with the patient and all questioned fully answered. He will return  if any problems arise.   Final diagnoses:  Wrist pain  Left wrist pain       Janne Napoleon, NP 12/06/14 2059  Alvira Monday, MD 12/08/14 2232

## 2014-12-06 NOTE — ED Notes (Signed)
Patient transported to X-ray 

## 2014-12-06 NOTE — ED Notes (Signed)
Pt. reports left wrist pain onset last month unrelieved b y OTC pain medication , denies injury / no deformity or swelling .

## 2015-02-27 IMAGING — CR DG CHEST 2V
2 series · 2 of 2 positions shown · non-contrast
Comparison: DG CHEST 2 VIEW dated 11/22/2011

CLINICAL DATA: Chest pain

EXAM:
CHEST  2 VIEW

[w chest pa]
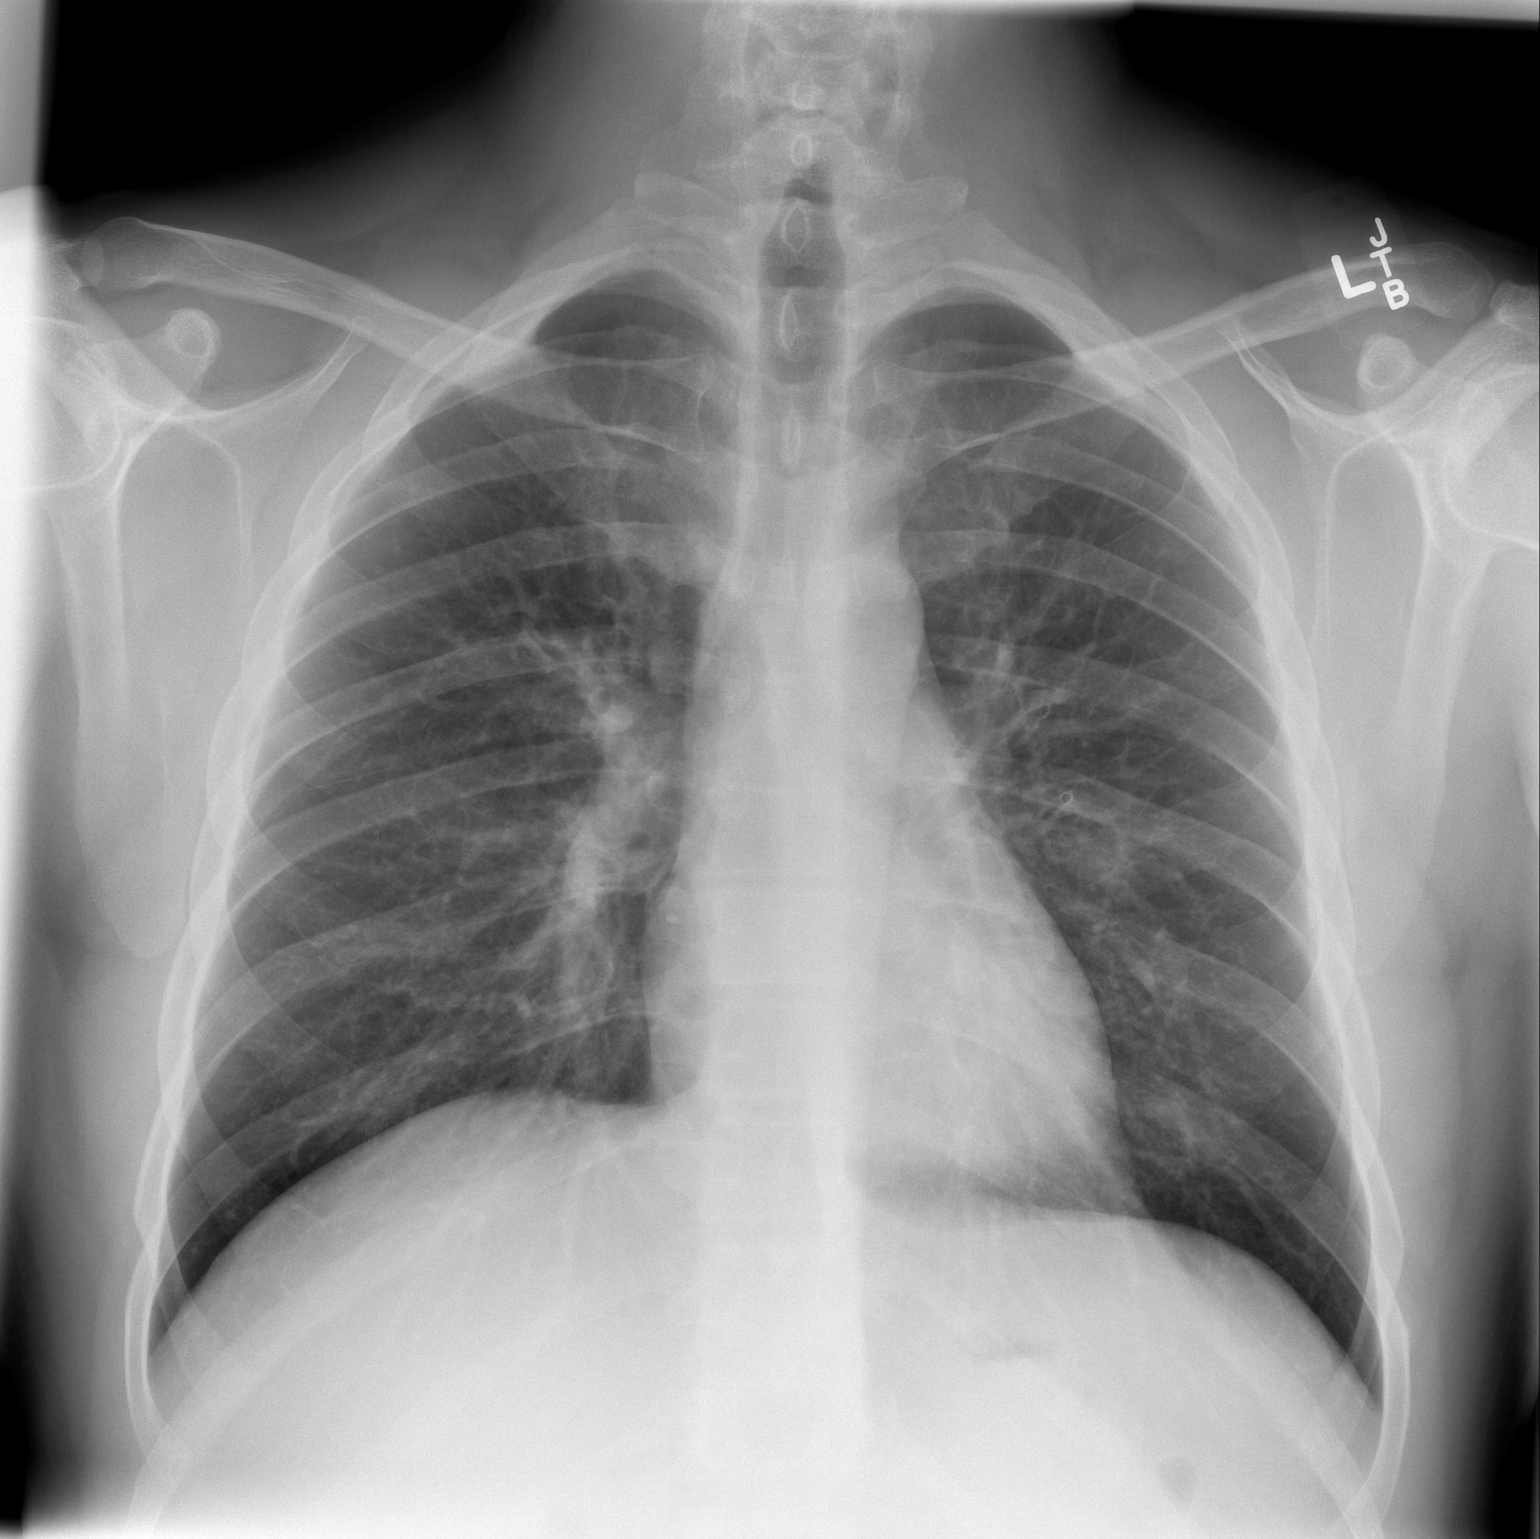

[w chest lat]
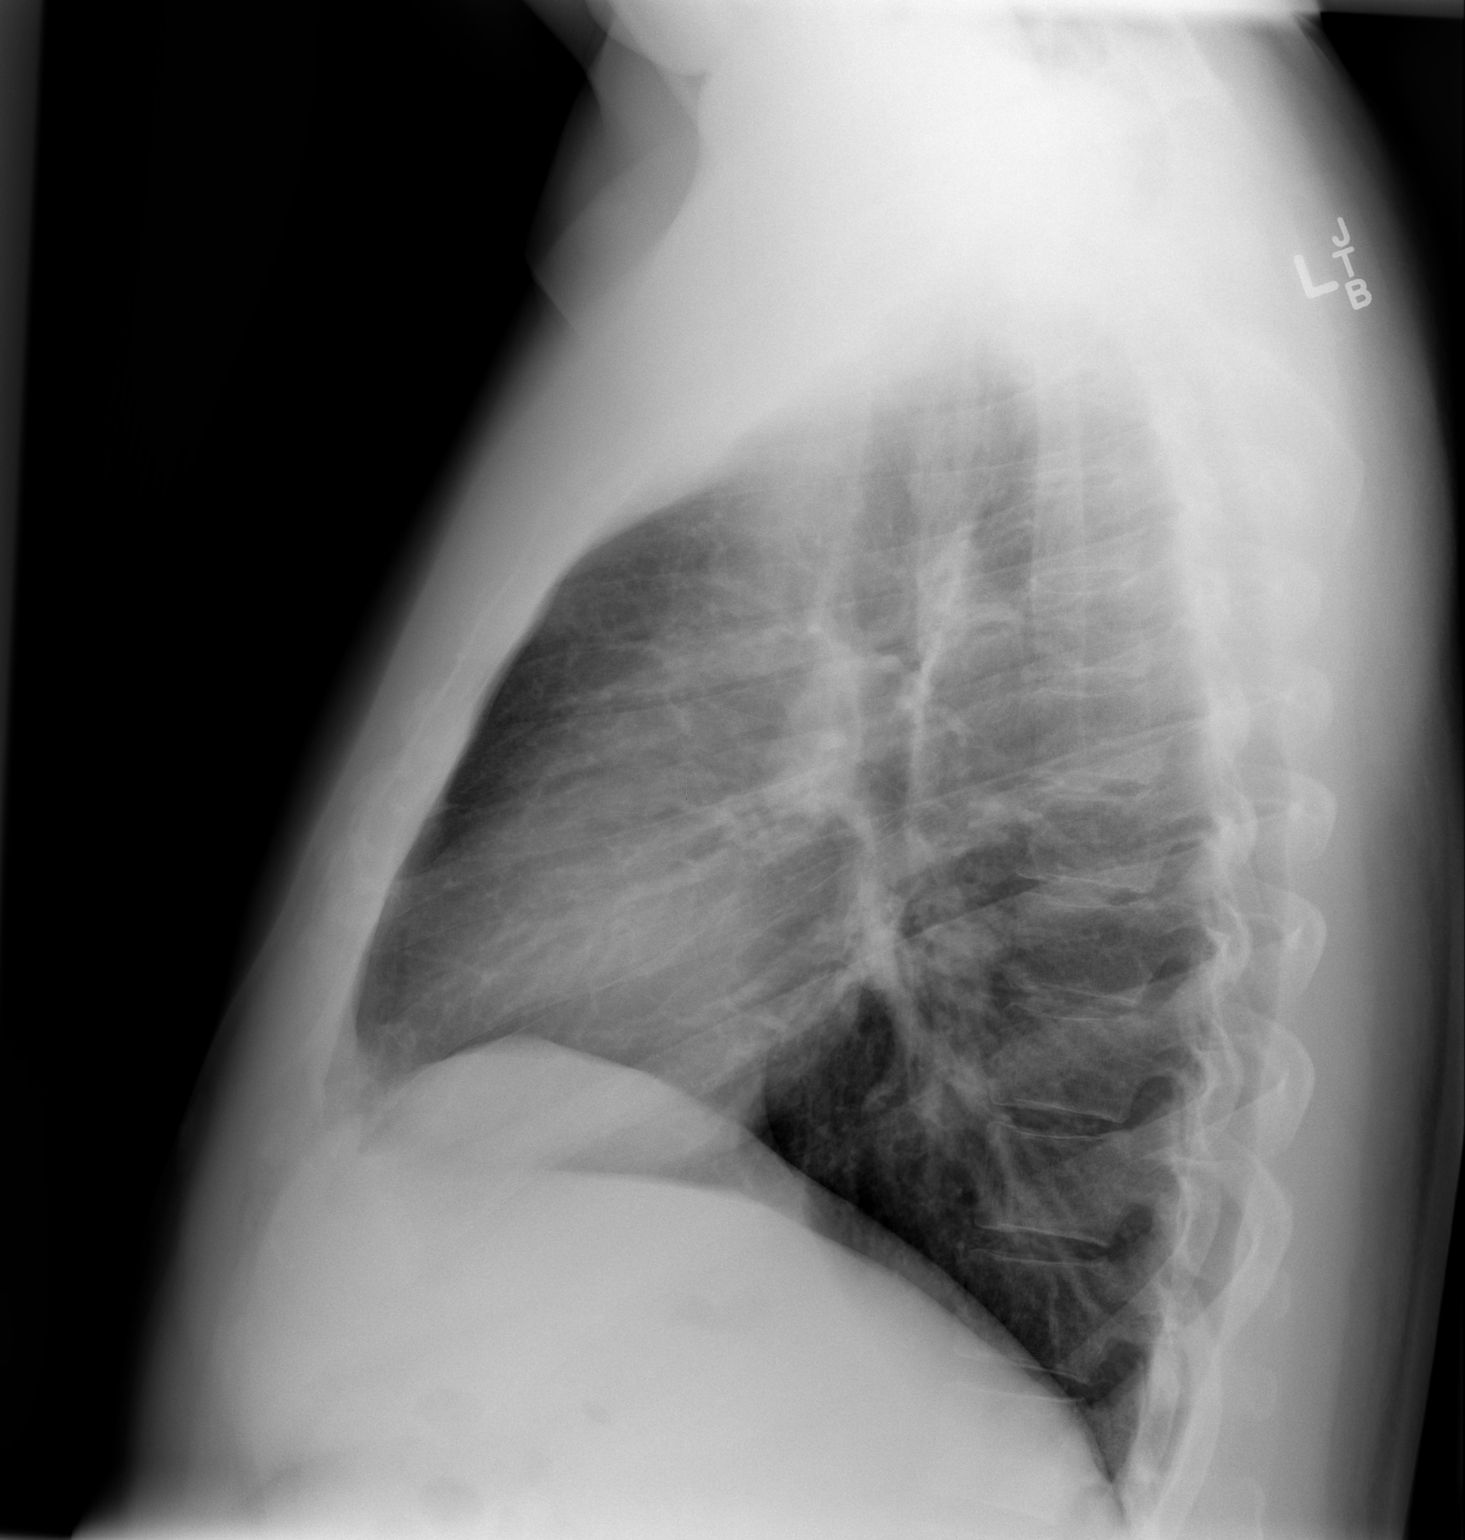

[2 of 2 positions shown; findings below may reference images not displayed]

FINDINGS: Normal heart size. Clear lungs. Bronchitic changes are chronic. No
pneumothorax.
IMPRESSION: No active cardiopulmonary disease.

## 2016-09-05 IMAGING — CR DG WRIST COMPLETE 3+V*L*
4 series · 4 of 4 positions shown · non-contrast
Comparison: None.

CLINICAL DATA: Medial LEFT wrist pain.  No injury.

EXAM:
LEFT WRIST - COMPLETE 3+ VIEW

[wrist pa]
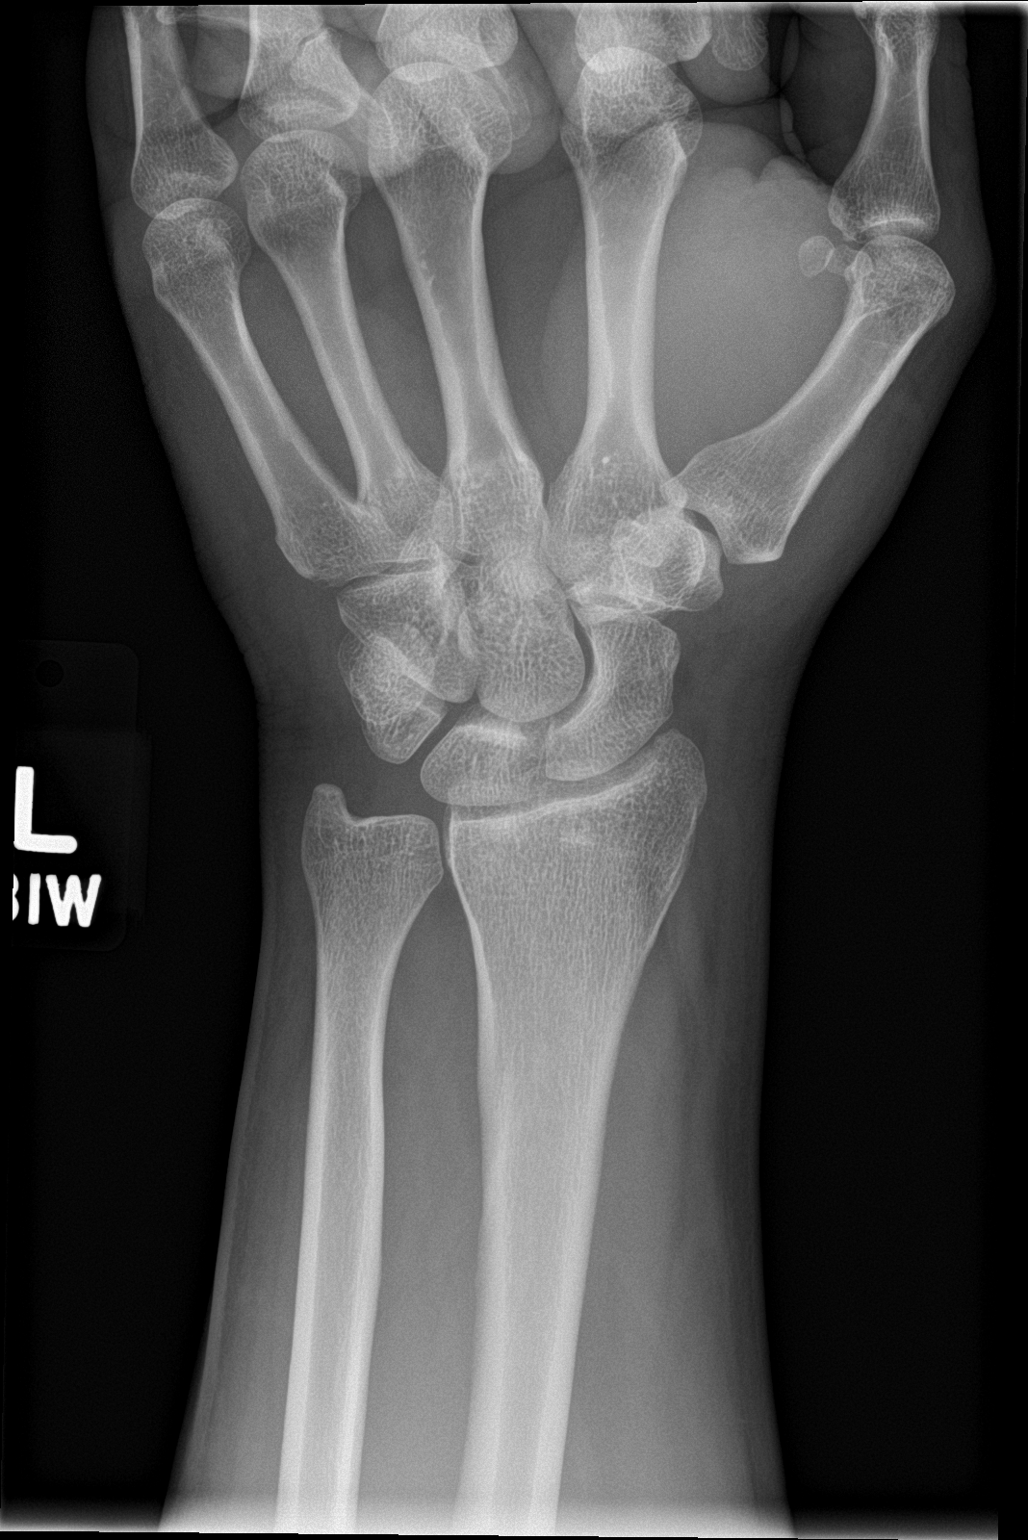

[wrist obl]
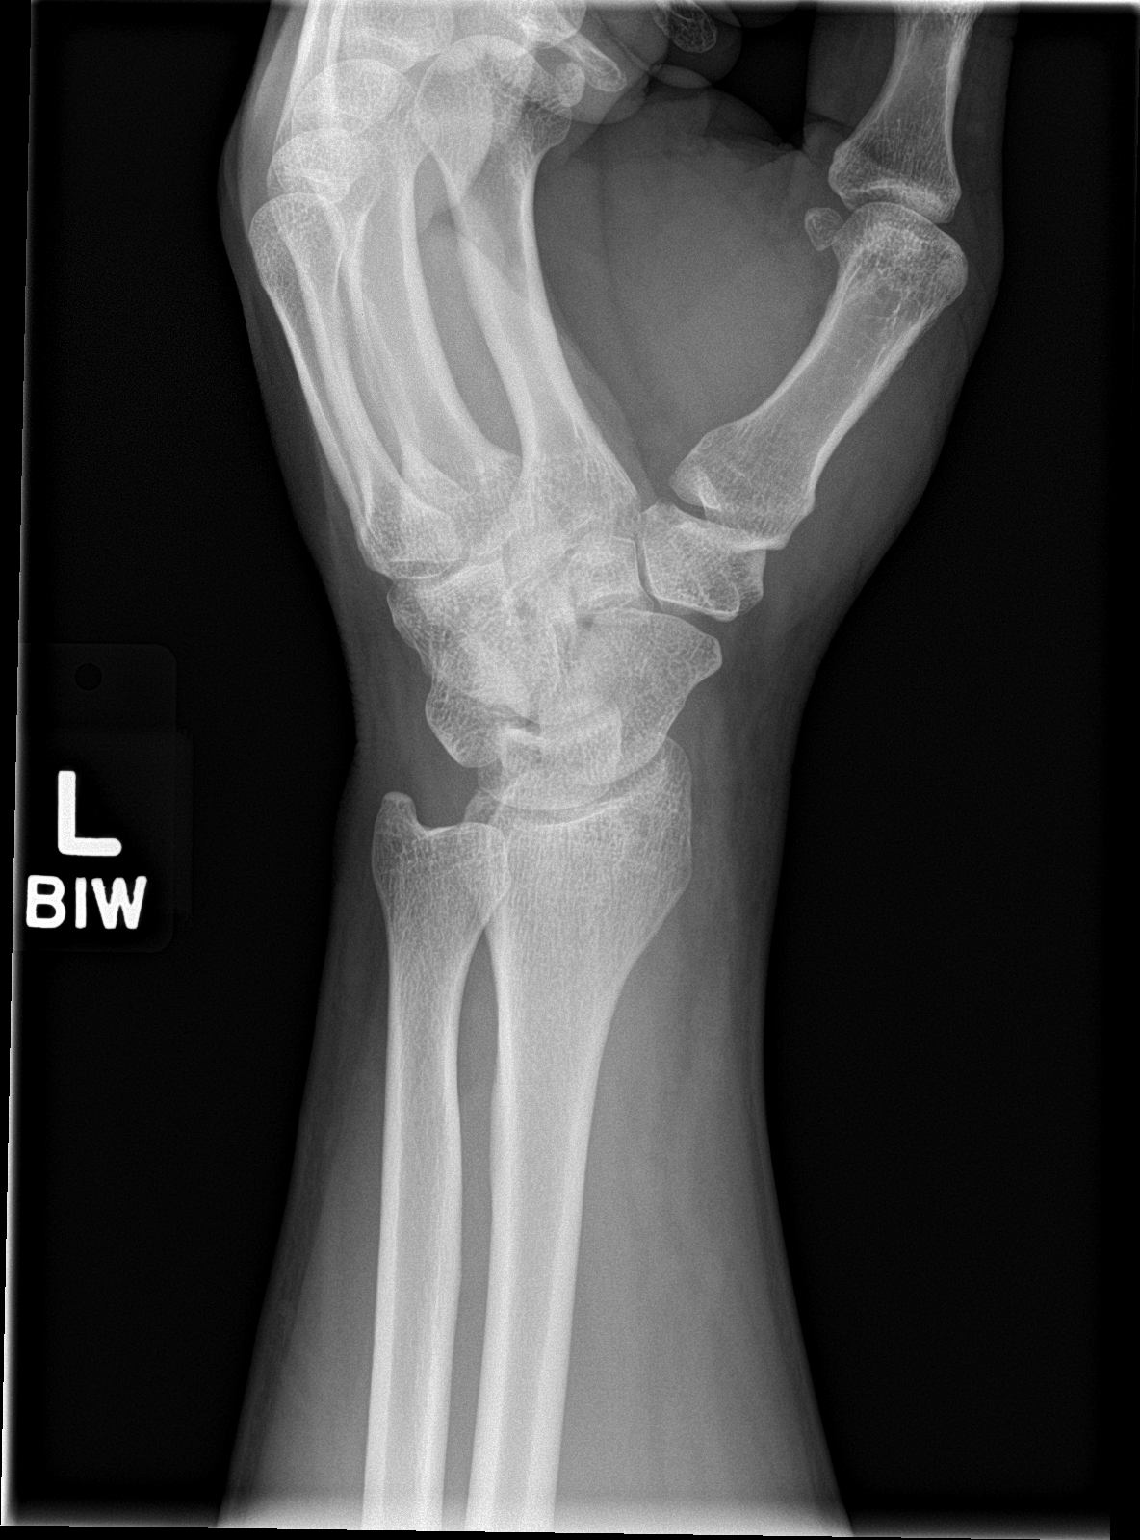

[wrist lat]
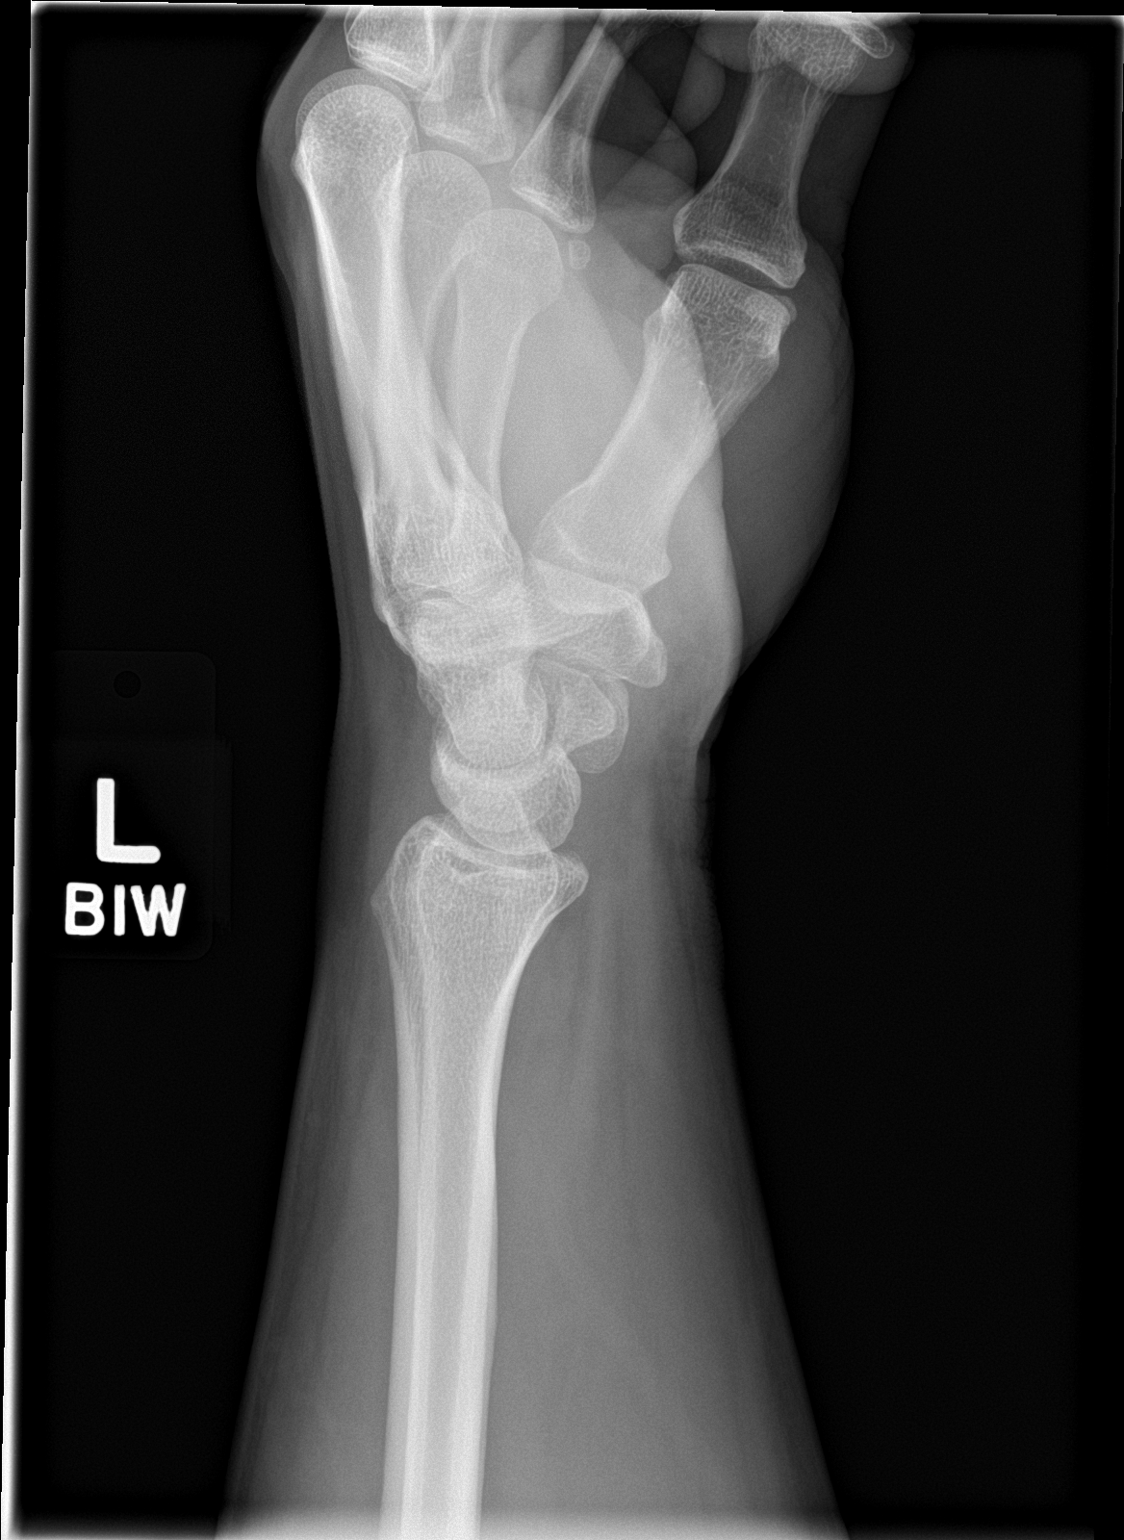

[wrist navicular]
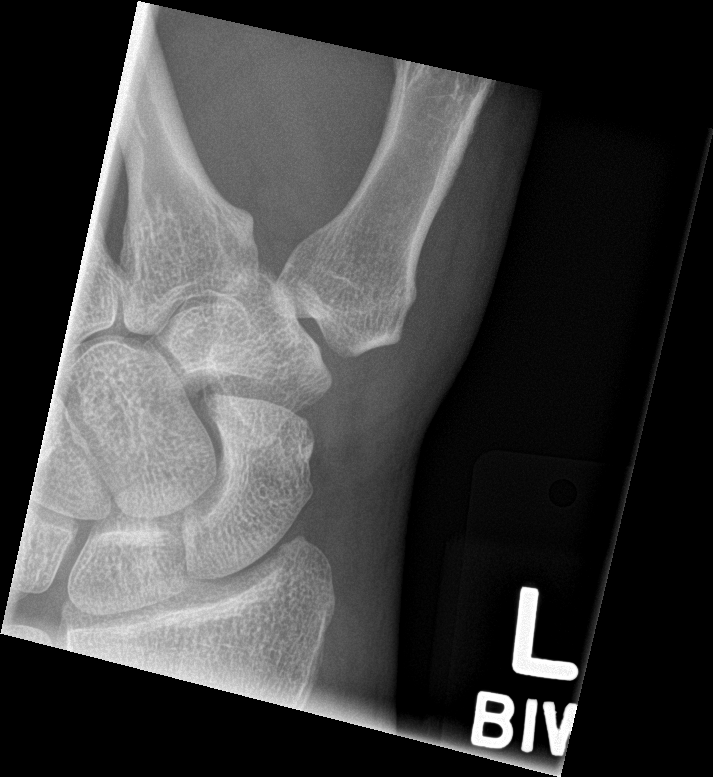

[4 of 4 positions shown; findings below may reference images not displayed]

FINDINGS: There is no evidence of fracture or dislocation. There is no
evidence of arthropathy or other focal bone abnormality. Soft
tissues are unremarkable.
IMPRESSION: Negative.

## 2017-10-20 ENCOUNTER — Encounter (HOSPITAL_COMMUNITY): Payer: Self-pay | Admitting: Emergency Medicine

## 2017-10-20 ENCOUNTER — Other Ambulatory Visit: Payer: Self-pay

## 2017-10-20 ENCOUNTER — Emergency Department (HOSPITAL_COMMUNITY)
Admission: EM | Admit: 2017-10-20 | Discharge: 2017-10-20 | Disposition: A | Discharge status: Home / Self Care | Payer: Self-pay | Attending: Emergency Medicine | Admitting: Emergency Medicine

## 2017-10-20 ENCOUNTER — Emergency Department (HOSPITAL_COMMUNITY): Payer: Self-pay

## 2017-10-20 DIAGNOSIS — M25561 Pain in right knee: Secondary | ICD-10-CM

## 2017-10-20 DIAGNOSIS — M25461 Effusion, right knee: Secondary | ICD-10-CM | POA: Insufficient documentation

## 2017-10-20 DIAGNOSIS — Z79899 Other long term (current) drug therapy: Secondary | ICD-10-CM | POA: Insufficient documentation

## 2017-10-20 NOTE — ED Notes (Signed)
Declined W/C at D/C and was escorted to lobby by RN. 

## 2017-10-20 NOTE — ED Provider Notes (Signed)
[facility] EMERGENCY DEPARTMENT Provider Note   CSN: [id number] Arrival date & time: 10/20/17  1448     History   Chief Complaint Chief Complaint  Patient presents with  . Knee Pain    HPI Larry Shannon is a 42 y.o. male who presents today for evaluation of right knee pain and swelling that started Tuesday.  He reports that he has a long-standing history of gout.  Initially he was treating this at home with ibuprofen 400 and that would resolve the pain until it wore off and then his pain would return.  He reports that on Thursday he felt like his pain was getting better, however Friday night it returned.  He does note that he had stopped taking the ibuprofen.  No fevers or chills.  He denies redness around the joint.  He reports that he is able to walk however it causes mild pain.  HPI  Past Medical History:  Diagnosis Date  . Gout     There are no active problems to display for this patient.   History reviewed. No pertinent surgical history.      Home Medications    Prior to Admission medications   Medication Sig Start Date End Date Taking? Authorizing Provider  diclofenac (VOLTAREN) 75 MG EC tablet Take 1 tablet (75 mg total) by mouth 2 (two) times daily. 12/06/14   Janne Napoleon, NP    Family History No family history on file.  Social History Social History   Tobacco Use  . Smoking status: Never Smoker  Substance Use Topics  . Alcohol use: Yes    Comment: occ  . Drug use: No     Allergies   Patient has no known allergies.   Review of Systems Review of Systems  Constitutional: Negative for chills, fatigue and fever.  Genitourinary: Negative for discharge.  Musculoskeletal: Positive for arthralgias and joint swelling. Negative for gait problem.  Skin: Negative for color change, rash and wound.  Neurological: Negative for weakness.  All other systems reviewed and are negative.    Physical Exam Updated Vital Signs BP 133/89 (BP  Location: Right Arm)   Pulse 82   Temp 98.2 F (36.8 C) (Oral)   Resp 16   Ht 6\' 2"  (1.88 m)   Wt 119.3 kg (263 lb)   SpO2 98%   BMI 33.77 kg/m   Physical Exam  Constitutional: He appears well-developed. No distress.  HENT:  Head: Normocephalic and atraumatic.  Cardiovascular: Intact distal pulses.  Right foot is warm, brisk capillary refill.  Right lower leg is warm.  Musculoskeletal:  Right leg is without obvious deformities.  There is no crepitus.  Patient is ambulatory with slight limp due to right knee pain.  Right knee has near normal active range of motion, flexion is slightly limited secondary to pain.  There is swelling around the knee consistent with a joint effusion.  Right knee is not abnormally warm.  Neurological:  Sensation intact to right lower leg.  Skin: Skin is warm and dry. He is not diaphoretic.  No wounds, abnormal erythema, induration, ecchymosis, or abnormalities over right knee.  Nursing note and vitals reviewed.    ED Treatments / Results  Labs (all labs ordered are listed, but only abnormal results are displayed) Labs Reviewed - No data to display  EKG None  Radiology Dg Knee Complete 4 Views Right  Result Date: 10/20/2017 CLINICAL DATA:  Right knee pain, swelling EXAM: RIGHT KNEE - COMPLETE 4+ VIEW  COMPARISON:  10/23/2013 FINDINGS: Moderate joint effusion. Joint spaces maintained. No acute bony abnormality. Specifically, no fracture, subluxation, or dislocation. IMPRESSION: Moderate joint effusion.  No acute bony abnormality. Electronically Signed   By: Charlett NoseKevin  Dover M.D.   On: 10/20/2017 16:11    Procedures Procedures (including critical care time)  Medications Ordered in ED Medications - No data to display   Initial Impression / Assessment and Plan / ED Course  I have reviewed the triage vital signs and the nursing notes.  Pertinent labs & imaging results that were available during my care of the patient were reviewed by me and  considered in my medical decision making (see chart for details).    Patient presents today for evaluation of atraumatic pain and swelling in his right knee.  This started earlier this week and was improving when he was taking regular ibuprofen, however the pain went away after a few days and he stopped taking it after which the pain returned.  He denies any signs of systemic illness, no fevers.  His right knee he is able to bear weight on, is not abnormally red, hot.  He has near normal flexion and extension of the knee.  Exam, history, not consistent with septic arthritis.  Did discuss with patient the option for arthrocentesis for diagnosis as he feels like this is not his normal gout.  Patient made the informed decision to decline this after we discussed risks, benefits of testing.  X-ray was obtained, interpreted as a joint effusion without acute bony abnormalities.  Patient was given strict return precautions, including those for septic arthritis.  He is instructed to continue over-the-counter ibuprofen and Tylenol for pain and symptomatic relief.  Return precautions were discussed and patient states understanding.  Given follow-up with orthopedics if fails to improve.  Final Clinical Impressions(s) / ED Diagnoses   Final diagnoses:  Acute pain of right knee  Effusion of right knee    ED Discharge Orders    None       Norman ClayHammond, Charmian Forbis W, PA-C 10/20/17 1710    Cathren LaineSteinl, Kevin, MD 10/21/17 [phone]

## 2017-10-20 NOTE — ED Triage Notes (Signed)
Pt  Updated on Knee x-ray. Results still pending.

## 2017-10-20 NOTE — ED Triage Notes (Signed)
Pt reports right knee pain and swelling that started Tuesday.  Pt denies any injury.  OTC medications assists then the pain returns.

## 2017-10-20 NOTE — Discharge Instructions (Addendum)
Please take Ibuprofen (Advil, motrin) and Tylenol (acetaminophen) to relieve your pain.  You may take up to 600 MG (3 pills) of normal strength ibuprofen every 8 hours as needed.  In between doses of ibuprofen you make take tylenol, up to 1,000 mg (two extra strength pills).  Do not take more than 3,000 mg tylenol in a 24 hour period.  Please check all medication labels as many medications such as pain and cold medications may contain tylenol.  Do not drink alcohol while taking these medications.  Do not take other NSAID'S while taking ibuprofen (such as aleve or naproxen).  Please take ibuprofen with food to decrease stomach upset.  As we discussed today 1 of your possible treatment options would have been arthrocentesis which is the medical term for pulling fluid out of your knee using a needle to send that for evaluation in the lab.  You declined this test.  If you develop fevers, generally feeling not well, nausea, vomiting, redness over your knee with your knee feeling hot to the touch, inability to bend your knee secondary to pain, worsening swelling, or have additional concerns please seek immediate medical care.  We discussed the possibility of an infection in your knee joint and that we cannot truly rule this out today without getting fluid out of your knee.

## 2018-02-05 ENCOUNTER — Encounter (HOSPITAL_COMMUNITY): Payer: Self-pay

## 2018-02-05 ENCOUNTER — Telehealth: Payer: Self-pay

## 2018-02-05 ENCOUNTER — Emergency Department (HOSPITAL_COMMUNITY)
Admission: EM | Admit: 2018-02-05 | Discharge: 2018-02-05 | Disposition: A | Discharge status: Home / Self Care | Payer: Self-pay | Attending: Emergency Medicine | Admitting: Emergency Medicine

## 2018-02-05 ENCOUNTER — Other Ambulatory Visit: Payer: Self-pay

## 2018-02-05 DIAGNOSIS — Z79899 Other long term (current) drug therapy: Secondary | ICD-10-CM | POA: Insufficient documentation

## 2018-02-05 DIAGNOSIS — R899 Unspecified abnormal finding in specimens from other organs, systems and tissues: Secondary | ICD-10-CM

## 2018-02-05 DIAGNOSIS — R799 Abnormal finding of blood chemistry, unspecified: Secondary | ICD-10-CM | POA: Insufficient documentation

## 2018-02-05 LAB — CBG MONITORING, ED: Glucose-Capillary: 112 mg/dL — ABNORMAL HIGH (ref 70–99)

## 2018-02-05 NOTE — Telephone Encounter (Signed)
Message received from Rosilyn Mingsamillia Wood, RN CM requesting an appointment for patient to establish care with [facility]. Informed her that the patient has an appointment already scheduled at [facility] 02/19/18 to establish with PCP.

## 2018-02-05 NOTE — ED Triage Notes (Signed)
Pt had labs drawn for work/health physical and was told that his blood sugar and cholesterol were elevated and needs medical clearance from a physician

## 2018-02-05 NOTE — Discharge Instructions (Addendum)
You were seen in the ER today for abnormal lab tests.   Your blood glucose today was 112.  We are working on setting up an appointment for you to establish care. Please see information regarding [place] regarding appointment.   Return to the ER for new or worsening symptoms or any other concerns.

## 2018-02-05 NOTE — ED Provider Notes (Signed)
[facility]ERGENCY [facility]PART[facility] Provider Note   CSN: [id number] Arrival date & time: 02/05/18  1414     History   Chief Complaint Chief Complaint  Patient presents with  . Hyperglycemia    HPI Milda Smartric Ekdahl is a 42 y.o. male with a hx of gout who presents to the ED requesting medical clearance for new work position. 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 He was told he would be unable to start work until he established care and these issues were being evaluated/managed. He is unable to establish care with PCP until January 3rd hence ER visit today. He has no physical complaints. No specific alleviating/aggravating factors. Denies headache, change in vision, fevers, polyuria/polydipsia, nausea, vomiting, abdominal pain, chest pain, or dyspnea.   HPI  Past Medical History:  Diagnosis Date  . Gout     There are no active problems to display for this patient.   History reviewed. No pertinent surgical history.      Home Medications    Prior to Admission medications   Medication Sig Start Date End Date Taking? Authorizing Provider  diclofenac (VOLTAREN) 75 MG EC tablet Take 1 tablet (75 mg total) by mouth 2 (two) times daily. 12/06/14   Janne NapoleonNeese, Hope M, NP    Family History History reviewed. No pertinent family history.  Social History Social History   Tobacco Use  . Smoking status: Never Smoker  . Smokeless tobacco: Never Used  Substance Use Topics  . Alcohol use: Yes    Comment: 6 beers per day  . Drug use: No     Allergies   Patient has no known allergies.   Review of Systems Review of Systems  Constitutional: Negative for chills and fever.  Respiratory: Negative for shortness of breath.   Cardiovascular: Negative for chest pain.  Gastrointestinal: Negative for abdominal pain, diarrhea, nausea and vomiting.  Endocrine: Negative for  polydipsia and polyuria.  Genitourinary: Negative for dysuria.  Neurological: Negative for weakness, numbness and headaches.     Physical Exam Updated Vital Signs BP (!) 145/107   Pulse 79   Temp 98.9 F (37.2 C) (Oral)   Resp 16   Ht 6\' 2"  (1.88 m)   Wt 117.9 kg   SpO2 100%   BMI 33.38 kg/m   Physical Exam  Constitutional: He appears well-developed and well-nourished. No distress.  HENT:  Head: Normocephalic and atraumatic.  Eyes: Conjunctivae are normal. Right eye exhibits no discharge. Left eye exhibits no discharge.  Cardiovascular: Normal rate and regular rhythm.  Pulmonary/Chest: Effort normal and breath sounds normal.  Abdominal: Soft. He exhibits no distension. There is no tenderness. There is no rigidity, no rebound and no guarding.  Neurological: He is alert.  Clear speech.   Psychiatric: He has a normal mood and affect. His behavior is normal. Thought content normal.  Nursing note and vitals reviewed.   ED Treatments / Results  Labs (all labs ordered are listed, but only abnormal results are displayed) Labs Reviewed  CBG MONITORING, ED - Abnormal; Notable for the following components:      Result Value   Glucose-Capillary 112 (*)    All other components within normal limits    EKG None  Radiology No results found.  Procedures Procedures (including critical care time)  Medications Ordered in ED Medications - No data to display   Initial Impression / Assessment and  Plan / ED Course  I have reviewed the triage vital signs and the nursing notes.  Pertinent labs & imaging results that were available during my care of the patient were reviewed by me and considered in my medical decision making (see chart for details).    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 He had outpatient blood work performed which revealed elevated fasting blood glucose of 155, elevated LFTs (AST 60, ALT 118), and  high cholesterol (total 326, triglycerides- 331, HDL- 45, VLDL-66, LDL- 215, total cholesterol/HDL ratio 7.2). His employer informed him he would not be able to start work until he was being evaluated for these conditions and they were being managed. He is unable to see a PCP until 03/29/18. He is completely asymptomatic and his non fasting CBG is 112 in the ER, do not feel that further ER evaluation is necessary at this time. Discussed with CSW/CM RN Durward Mallard who is working on getting patient an appointment with [facility]- relays he may be discharged and she will call him with appointment information. I discussed patient's prior lab results with him and the meaning of each of these. I discussed results, plan, need for follow-up (PCP vs. Ortho), and return precautions with the patient. Provided opportunity for questions, patient confirmed understanding and is in agreement with plan.   Findings and plan of care discussed with supervising physician Dr. Rosalia Hammers- in agreement.   Final Clinical Impressions(s) / ED Diagnoses   Final diagnoses:  Abnormal laboratory test    ED Discharge Orders    None       Cherly Anderson, PA-C 02/05/18 1524    Margarita Grizzle, MD 02/05/18 1909

## 2018-02-05 NOTE — ED Notes (Signed)
Declined W/C at D/C and was escorted to lobby by RN. 

## 2018-02-05 NOTE — Discharge Planning (Signed)
EDCM attempted to get PCP appointment; [facility].  [facility] will call pt with appointment.

## 2018-02-19 ENCOUNTER — Ambulatory Visit: Payer: Self-pay | Admitting: Emergency Medicine

## 2018-05-03 ENCOUNTER — Ambulatory Visit: Payer: Self-pay | Admitting: Registered"

## 2019-01-14 ENCOUNTER — Encounter (HOSPITAL_COMMUNITY): Payer: Self-pay

## 2019-01-14 ENCOUNTER — Other Ambulatory Visit: Payer: Self-pay

## 2019-01-14 ENCOUNTER — Emergency Department (HOSPITAL_COMMUNITY)
Admission: EM | Admit: 2019-01-14 | Discharge: 2019-01-14 | Disposition: A | Discharge status: Home / Self Care | Payer: 59 | Attending: Emergency Medicine | Admitting: Emergency Medicine

## 2019-01-14 DIAGNOSIS — B349 Viral infection, unspecified: Secondary | ICD-10-CM | POA: Insufficient documentation

## 2019-01-14 DIAGNOSIS — M791 Myalgia, unspecified site: Secondary | ICD-10-CM | POA: Diagnosis not present

## 2019-01-14 DIAGNOSIS — U071 COVID-19: Secondary | ICD-10-CM

## 2019-01-14 DIAGNOSIS — R509 Fever, unspecified: Secondary | ICD-10-CM | POA: Diagnosis present

## 2019-01-14 MED ORDER — ALBUTEROL SULFATE HFA 108 (90 BASE) MCG/ACT IN AERS: 2.0000 | INHALATION_SPRAY | RESPIRATORY_TRACT | 0 refills | Status: AC | PRN

## 2019-01-14 MED ORDER — PREDNISONE 10 MG PO TABS: 20.0000 mg | ORAL_TABLET | Freq: Two times a day (BID) | ORAL | 0 refills | Status: AC

## 2019-01-14 MED ORDER — ONDANSETRON 4 MG PO TBDP: 4.0000 mg | ORAL_TABLET | Freq: Three times a day (TID) | ORAL | 0 refills | Status: DC | PRN

## 2019-01-14 NOTE — ED Triage Notes (Signed)
Pt reports generalized body aches, fever since he was tested for COVID yesterday. Pt was tested due to exposure. Pt a.o., resp e/u

## 2019-01-14 NOTE — ED Provider Notes (Signed)
[facility] EMERGENCY DEPARTMENT Provider Note   CSN: [id number] Arrival date & time: 01/14/19  2154     History   Chief Complaint Chief Complaint  Patient presents with  . Fever  . Generalized Body Aches    HPI Larry Shannon is a 43 y.o. male.     HPI   Larry Shannon is a 43 y.o. male, patient with no pertinent past medical history, presenting to the ED with fever and body aches beginning yesterday.  He was tested for COVID-19 2 days ago and positive results returned yesterday. He states, "I was taking Tylenol PM for my fever, the package said I could not take anymore and 2 pills in a 24-hour period, my fever came back before 24 hours was up, so I came to get evaluated."  Denies cough, shortness of breath, chest pain, abdominal pain, N/V/D, dizziness, or any other complaints.     Past Medical History:  Diagnosis Date  . Gout     There are no active problems to display for this patient.   History reviewed. No pertinent surgical history.      Home Medications    Prior to Admission medications   Medication Sig Start Date End Date Taking? Authorizing Provider  albuterol (VENTOLIN HFA) 108 (90 Base) MCG/ACT inhaler Inhale 2 puffs into the lungs every 4 (four) hours as needed for wheezing or shortness of breath. 01/14/19   Ermina Oberman C, PA-C  diclofenac (VOLTAREN) 75 MG EC tablet Take 1 tablet (75 mg total) by mouth 2 (two) times daily. 12/06/14   Ashley Murrain, NP  ondansetron (ZOFRAN ODT) 4 MG disintegrating tablet Take 1 tablet (4 mg total) by mouth every 8 (eight) hours as needed for nausea or vomiting. 01/14/19   Ceasia Elwell C, PA-C  predniSONE (DELTASONE) 10 MG tablet Take 2 tablets (20 mg total) by mouth 2 (two) times daily with a meal for 4 days. 01/14/19 01/18/19  Lorayne Bender, PA-C    Family History No family history on file.  Social History Social History   Tobacco Use  . Smoking status: Never Smoker  . Smokeless tobacco: Never Used   Substance Use Topics  . Alcohol use: Yes    Comment: 6 beers per day  . Drug use: No     Allergies   Patient has no known allergies.   Review of Systems Review of Systems  Constitutional: Positive for fever.  Respiratory: Negative for cough and shortness of breath.   Cardiovascular: Negative for chest pain.  Gastrointestinal: Negative for abdominal pain, diarrhea, nausea and vomiting.  Musculoskeletal: Positive for myalgias.  Neurological: Negative for dizziness and syncope.  All other systems reviewed and are negative.    Physical Exam Updated Vital Signs BP 140/80 (BP Location: Right Arm)   Pulse 96   Temp 99.5 F (37.5 C) (Oral)   Resp 18   Ht 6\' 2"  (1.88 m)   Wt 117.9 kg   SpO2 95%   BMI 33.38 kg/m   Physical Exam Vitals signs and nursing note reviewed.  Constitutional:      General: He is not in acute distress.    Appearance: He is well-developed. He is not diaphoretic.  HENT:     Head: Normocephalic and atraumatic.     Mouth/Throat:     Mouth: Mucous membranes are moist.     Pharynx: Oropharynx is clear.  Eyes:     Conjunctiva/sclera: Conjunctivae normal.  Neck:     Musculoskeletal: Neck supple.  Cardiovascular:     Rate and Rhythm: Normal rate and regular rhythm.     Pulses: Normal pulses.          Radial pulses are 2+ on the right side and 2+ on the left side.       Posterior tibial pulses are 2+ on the right side and 2+ on the left side.     Heart sounds: Normal heart sounds.     Comments: Tactile temperature in the extremities appropriate and equal bilaterally. Pulmonary:     Effort: Pulmonary effort is normal. No respiratory distress.     Breath sounds: Normal breath sounds.     Comments: No increased work of breathing.  Speaks in full sentences without difficulty. Abdominal:     Palpations: Abdomen is soft.     Tenderness: There is no abdominal tenderness. There is no guarding.  Musculoskeletal:     Right lower leg: No edema.     Left  lower leg: No edema.  Lymphadenopathy:     Cervical: No cervical adenopathy.  Skin:    General: Skin is warm and dry.  Neurological:     Mental Status: He is alert.  Psychiatric:        Mood and Affect: Mood and affect normal.        Speech: Speech normal.        Behavior: Behavior normal.      ED Treatments / Results  Labs (all labs ordered are listed, but only abnormal results are displayed) Labs Reviewed - No data to display  EKG None  Radiology No results found.  Procedures Procedures (including critical care time)  Medications Ordered in ED Medications - No data to display   Initial Impression / Assessment and Plan / ED Course  I have reviewed the triage vital signs and the nursing notes.  Pertinent labs & imaging results that were available during my care of the patient were reviewed by me and considered in my medical decision making (see chart for details).  Clinical Course as of Jan 14 2339  Tue Jan 14, 2019  2305 Patient was not actually tachypneic when reassessed.  Resp(!): 30 [SJ]    Clinical Course User Index [SJ] Young Brim C, PA-C       Patient presents for evaluation following a positive test for COVID-19. Patient is nontoxic appearing, not tachycardic upon my exam, not tachypneic, not hypotensive, maintains excellent SPO2 on room air, and is in no apparent distress.  The patient was given instructions for home care as well as return precautions. Patient voices understanding of these instructions, accepts the plan, and is comfortable with discharge.  Final Clinical Impressions(s) / ED Diagnoses   Final diagnoses:  Viral syndrome  COVID-19    ED Discharge Orders         Ordered    albuterol (VENTOLIN HFA) 108 (90 Base) MCG/ACT inhaler  Every 4 hours PRN     01/14/19 2256    predniSONE (DELTASONE) 10 MG tablet  2 times daily with meals     01/14/19 2256    ondansetron (ZOFRAN ODT) 4 MG disintegrating tablet  Every 8 hours PRN     01/14/19  2257           Anselm Pancoast, PA-C 01/14/19 2341    Raeford Razor, MD 01/15/19 1521

## 2019-01-14 NOTE — ED Notes (Signed)
Patient verbalizes understanding of discharge instructions. Opportunity for questioning and answers were provided. Armband removed by staff, pt discharged from ED. Pt. ambulatory and discharged home.  

## 2019-01-14 NOTE — Discharge Instructions (Signed)
Viruses do not require or respond to antibiotics. Treatment is symptomatic care and it is important to note that these symptoms may last for 7-14 days.   Hand washing: Wash your hands throughout the day, but especially before and after touching the face, using the restroom, sneezing, coughing, or touching surfaces that have been coughed or sneezed upon. Hydration: Symptoms of most illnesses will be intensified and complicated by dehydration. Dehydration can also extend the duration of symptoms. Drink plenty of fluids and get plenty of rest. You should be drinking at least half a liter of water an hour to stay hydrated. Electrolyte drinks (ex. Gatorade, Powerade, Pedialyte) are also encouraged. You should be drinking enough fluids to make your urine light yellow, almost clear. If this is not the case, you are not drinking enough water. Please note that some of the treatments indicated below will not be effective if you are not adequately hydrated. Pain or fever: Ibuprofen, Naproxen, or acetaminophen (generic for Tylenol) for pain or fever.  Antiinflammatory medications: Take 600 mg of ibuprofen every 6 hours or 440 mg (over the counter dose) to 500 mg (prescription dose) of naproxen every 12 hours for the next 3 days. After this time, these medications may be used as needed for pain. Take these medications with food to avoid upset stomach. Choose only one of these medications, do not take them together. Acetaminophen (generic for Tylenol): Should you continue to have additional pain while taking the ibuprofen or naproxen, you may add in acetaminophen as needed. Your daily total maximum amount of acetaminophen from all sources should be limited to 4000mg /day for persons without liver problems, or 2000mg /day for those with liver problems. Nausea/vomiting: Use the ondansetron (generic for Zofran) for nausea or vomiting.  This medication may not prevent all vomiting or nausea, but can help facilitate better  hydration. Things that can help with nausea/vomiting also include peppermint/menthol candies, vitamin B12, and ginger. Albuterol: May use the albuterol as needed for instances of shortness of breath. Prednisone: Take the prednisone, as directed, in its entirety if shortness of breath develops. Zyrtec or Claritin: May add these medication daily to control underlying symptoms of congestion, sneezing, and other signs of allergies.  These medications are available over-the-counter. Generics: Cetirizine (generic for Zyrtec) and loratadine (generic for Claritin). Fluticasone: Use fluticasone (generic for Flonase), as directed, for nasal and sinus congestion.  This medication is available over-the-counter. Congestion: Plain guaifenesin (generic for plain Mucinex) may help relieve congestion. Saline sinus rinses and saline nasal sprays may also help relieve congestion.  Sore throat: Warm liquids or Chloraseptic spray may help soothe a sore throat. Gargle twice a day with a salt water solution made from a half teaspoon of salt in a cup of warm water.  Follow up: Follow up with a primary care provider within the next two weeks should symptoms fail to resolve. Return: Return to the ED for significantly worsening symptoms, shortness of breath, persistent vomiting, large amounts of blood in stool, or any other major concerns.  For prescription assistance, may try using prescription discount sites or apps, such as goodrx.com  Patients who have symptoms consistent with COVID-19 should self isolate until: At least 3 days (72 hours) have passed since recovery, defined as resolution of fever without the use of fever reducing medications and improvement in respiratory symptoms (e.g., cough, shortness of breath), and At least 7 days have passed since symptoms first appeared.

## 2019-04-28 ENCOUNTER — Ambulatory Visit (HOSPITAL_COMMUNITY)
Admission: EM | Admit: 2019-04-28 | Discharge: 2019-04-28 | Disposition: A | Discharge status: Home / Self Care | Payer: 59 | Attending: Family Medicine | Admitting: Family Medicine

## 2019-04-28 ENCOUNTER — Other Ambulatory Visit: Payer: Self-pay

## 2019-04-28 ENCOUNTER — Encounter (HOSPITAL_COMMUNITY): Payer: Self-pay

## 2019-04-28 DIAGNOSIS — R1013 Epigastric pain: Secondary | ICD-10-CM | POA: Diagnosis not present

## 2019-04-28 MED ORDER — LIDOCAINE VISCOUS HCL 2 % MT SOLN
OROMUCOSAL | Status: AC
  Filled 2019-04-28: qty 15

## 2019-04-28 MED ORDER — LIDOCAINE VISCOUS HCL 2 % MT SOLN
15.0000 mL | Freq: Once | OROMUCOSAL | Status: AC
  Administered 2019-04-28: 15 mL via ORAL

## 2019-04-28 MED ORDER — ALUM & MAG HYDROXIDE-SIMETH 200-200-20 MG/5ML PO SUSP
ORAL | Status: AC
  Filled 2019-04-28: qty 30

## 2019-04-28 MED ORDER — OMEPRAZOLE 20 MG PO CPDR: DELAYED_RELEASE_CAPSULE | ORAL | 0 refills | Status: AC

## 2019-04-28 MED ORDER — ALUM & MAG HYDROXIDE-SIMETH 200-200-20 MG/5ML PO SUSP
30.0000 mL | Freq: Once | ORAL | Status: AC
  Administered 2019-04-28: 30 mL via ORAL

## 2019-04-28 NOTE — Discharge Instructions (Addendum)
Take the omeprazole as directed This reduces stomach acid See your doctor in follow up

## 2019-04-28 NOTE — ED Provider Notes (Signed)
[facility]    CSN: [id number] Arrival date & time: 04/28/19  1725      History   Chief Complaint Chief Complaint  Patient presents with  . Heartburn    HPI Larry Shannon is a 44 y.o. male.   HPI  Has had heartburn since last night Took ibuprofen for a headache Does not smoke Drinks alcohol in moderation about 1-2 days a week Says stress is stable Usually a tums works " like magic" Last night took several Today took more and still feels burning in central chest and belching He does not describe chest pressure.  He does not have shortness of breath.  He does not have lightheadedness or diaphoresis.  He does not have a history of heart problems.  He does have hyperlipidemia.  He has prediabetes.  He is in the process of going on a diet and exercise program to improve his health.  Past Medical History:  Diagnosis Date  . Gout     There are no problems to display for this patient.   History reviewed. No pertinent surgical history.     Home Medications    Prior to Admission medications   Medication Sig Start Date End Date Taking? Authorizing Provider  albuterol (VENTOLIN HFA) 108 (90 Base) MCG/ACT inhaler Inhale 2 puffs into the lungs every 4 (four) hours as needed for wheezing or shortness of breath. 01/14/19   Joy, Shawn C, PA-C  omeprazole (PRILOSEC) 20 MG capsule Take 2 x a day for one week then once a day until gone 04/28/19   Eustace Moore, MD    Family History Family History  Problem Relation Age of Onset  . COPD Mother     Social History Social History   Tobacco Use  . Smoking status: Never Smoker  . Smokeless tobacco: Never Used  Substance Use Topics  . Alcohol use: Yes    Comment: twice weekly  . Drug use: No     Allergies   Patient has no known allergies.   Review of Systems Review of Systems  Constitutional: Negative for activity change, appetite change and unexpected weight change.  Respiratory: Negative for chest  tightness and shortness of breath.   Cardiovascular: Negative for chest pain, palpitations and leg swelling.  Gastrointestinal: Positive for abdominal pain. Negative for nausea and vomiting.     Physical Exam Triage Vital Signs ED Triage Vitals  Enc Vitals Group     BP 04/28/19 1750 125/79     Pulse Rate 04/28/19 1750 79     Resp 04/28/19 1750 16     Temp 04/28/19 1750 98.1 F (36.7 C)     Temp Source 04/28/19 1750 Oral     SpO2 04/28/19 1750 100 %     Weight --      Height --      Head Circumference --      Peak Flow --      Pain Score 04/28/19 1748 3     Pain Loc --      Pain Edu? --      Excl. in GC? --    No data found.  Updated Vital Signs BP 125/79 (BP Location: Right Arm)   Pulse 79   Temp 98.1 F (36.7 C) (Oral)   Resp 16   SpO2 100%      Physical Exam Constitutional:      General: He is not in acute distress.    Appearance: Normal appearance. He is well-developed.  HENT:  Head: Normocephalic and atraumatic.  Eyes:     Conjunctiva/sclera: Conjunctivae normal.     Pupils: Pupils are equal, round, and reactive to light.  Cardiovascular:     Rate and Rhythm: Normal rate and regular rhythm.     Heart sounds: Normal heart sounds.  Pulmonary:     Effort: Pulmonary effort is normal. No respiratory distress.     Breath sounds: Normal breath sounds.  Abdominal:     General: Bowel sounds are normal. There is no distension.     Palpations: Abdomen is soft.     Tenderness: There is abdominal tenderness.     Comments: Minimal tenderness to palp epigastrium  Musculoskeletal:        General: Normal range of motion.     Cervical back: Normal range of motion.  Skin:    General: Skin is warm and dry.  Neurological:     General: No focal deficit present.     Mental Status: He is alert.  Psychiatric:        Mood and Affect: Mood normal.      UC Treatments / Results  Labs (all labs ordered are listed, but only abnormal results are displayed) Labs  Reviewed - No data to display  EKG- normal rate and rhythm. Intervals.  No ST or T changes Compared with 2015   Radiology No results found.  Procedures Procedures (including critical care time)  Medications Ordered in UC Medications  alum & mag hydroxide-simeth (MAALOX/MYLANTA) 200-200-20 MG/5ML suspension 30 mL (30 mLs Oral Given 04/28/19 1827)    And  lidocaine (XYLOCAINE) 2 % viscous mouth solution 15 mL (15 mLs Oral Given 04/28/19 1827)    Initial Impression / Assessment and Plan / UC Course  I have reviewed the triage vital signs and the nursing notes.  Pertinent labs & imaging results that were available during my care of the patient were reviewed by me and considered in my medical decision making (see chart for details).     EKG unchanged from prior Patient got immediate relief from a GI cocktail We discussed that his symptoms are gastrointestinal and not cardiac Final Clinical Impressions(s) / UC Diagnoses   Final diagnoses:  Dyspepsia     Discharge Instructions     Take the omeprazole as directed This reduces stomach acid See your doctor in follow up   ED Prescriptions    Medication Sig Dispense Auth. Provider   omeprazole (PRILOSEC) 20 MG capsule Take 2 x a day for one week then once a day until gone 30 capsule Meda Coffee Jennette Banker, MD     PDMP not reviewed this encounter.   Raylene Everts, MD 04/28/19 [phone]

## 2019-04-28 NOTE — ED Triage Notes (Signed)
Patient presents to Urgent Care with complaints of severe heartburn and chest pressure since last night. Patient reports he has not had heart problems in the past, close to being diabetic and borderline HTN.

## 2020-05-23 ENCOUNTER — Other Ambulatory Visit: Payer: Self-pay

## 2020-05-23 ENCOUNTER — Encounter: Payer: Self-pay | Admitting: *Deleted

## 2020-05-23 ENCOUNTER — Ambulatory Visit
Admission: EM | Admit: 2020-05-23 | Discharge: 2020-05-23 | Disposition: A | Discharge status: Home / Self Care | Payer: 59 | Attending: Emergency Medicine | Admitting: Emergency Medicine

## 2020-05-23 DIAGNOSIS — Z20822 Contact with and (suspected) exposure to covid-19: Secondary | ICD-10-CM | POA: Diagnosis not present

## 2020-05-23 DIAGNOSIS — B349 Viral infection, unspecified: Secondary | ICD-10-CM | POA: Diagnosis not present

## 2020-05-23 DIAGNOSIS — R519 Headache, unspecified: Secondary | ICD-10-CM

## 2020-05-23 HISTORY — DX: COVID-19: U07.1

## 2020-05-23 MED ORDER — IBUPROFEN 600 MG PO TABS: 600.0000 mg | ORAL_TABLET | Freq: Four times a day (QID) | ORAL | 0 refills | Status: AC | PRN

## 2020-05-23 MED ORDER — ONDANSETRON 8 MG PO TBDP: ORAL_TABLET | ORAL | 0 refills | Status: AC

## 2020-05-23 NOTE — ED Triage Notes (Signed)
C/O HA, chills, nausea and vomiting onset 3 days ago with fever up to 100.  States able to keep down PO fluids today.  Denies any cough, congestion.

## 2020-05-23 NOTE — ED Provider Notes (Signed)
HPI  SUBJECTIVE:  Larry Shannon is a 45 y.o. male who reports 3 days had a gradual onset, constant, daily, headache located along both of his temples with fevers T-max 100.3.  He reports waking up with nausea followed by a single episode of emesis at night only for the past 2 nights.  He is tolerating p.o. today.  He also reports some diarrhea.  No body aches, nasal congestion, rhinorrhea, sore throat, loss of sense of smell or taste, cough, shortness of breath, abdominal pain.  It did not occur with exertion,  It was not maximal at onset nor is is the worst headache of his life.  He has had similar headaches before.  No neck stiffness, rash, photophobia, ear pain, visual changes, jaw pain, dental pain, arm or leg weakness, facial droop, slurred speech, discoordination.  No change in urine output, lightheadedness, dizziness.  No flu or Covid exposure.  He got the second dose of the Moderna vaccine over 6 months ago.  He did not get a flu vaccine.  He has tried ibuprofen 400 mg once daily with minimal improvement in his symptoms.  No aggravating factors.  Past medical history of COVID in 10/20, gout.  No recent sinusitis, diabetes, hypertension, frequent sinusitis, stroke, atrial fibrillation, aneurysm.  PMD: [facility].    Past Medical History:  Diagnosis Date  . COVID-19   . Gout     History reviewed. No pertinent surgical history.  Family History  Problem Relation Age of Onset  . COPD Mother     Social History   Tobacco Use  . Smoking status: Never Smoker  . Smokeless tobacco: Never Used  Vaping Use  . Vaping Use: Never used  Substance Use Topics  . Alcohol use: Yes    Comment: twice weekly  . Drug use: No    No current facility-administered medications for this encounter.  Current Outpatient Medications:  .  ibuprofen (ADVIL) 600 MG tablet, Take 1 tablet (600 mg total) by mouth every 6 (six) hours as needed., Disp: 30 tablet, Rfl: 0 .  ondansetron (ZOFRAN ODT) 8 MG  disintegrating tablet, 1/2- 1 tablet q 8 hr prn nausea, vomiting, Disp: 20 tablet, Rfl: 0 .  albuterol (VENTOLIN HFA) 108 (90 Base) MCG/ACT inhaler, Inhale 2 puffs into the lungs every 4 (four) hours as needed for wheezing or shortness of breath., Disp: 18 g, Rfl: 0 .  omeprazole (PRILOSEC) 20 MG capsule, Take 2 x a day for one week then once a day until gone, Disp: 30 capsule, Rfl: 0  No Known Allergies   ROS  As noted in HPI.   Physical Exam  BP (!) 144/86 (BP Location: Right Arm)   Pulse 86   Temp 98.2 F (36.8 C) (Oral)   Resp 20   SpO2 97%   Constitutional: Well developed, well nourished, no acute distress Eyes: PERRL, EOMI, conjunctiva normal bilaterally. No direct or consensual photophobia. No pain with EOMs.  ENT: Normocephalic, atraumatic,mucus membranes moist, normal dentition.  TM normal b/l. No TMJ tenderness. No nasal congestion, mild frontal sinus tenderness. No maxillary sinus tenderness. No temporal artery tenderness.  Neck: no cervical LN, no trapezial muscle tenderness. No meningismus Respiratory: normal inspiratory effort Cardiovascular: Normal rate, regular rhythm GI:  nondistended skin: No rash, skin intact Musculoskeletal: No edema, no tenderness, no deformities Neurologic: Alert & oriented x 3, CN III-XII intact, romberg neg, finger-> nose, heel-> shin equal b/l, Romberg neg, tandem gait steady Psychiatric: Speech and behavior appropriate   ED  Course   Medications - No data to display  Orders Placed This Encounter  Procedures  . Novel Coronavirus, NAA ([facility])    Standing Status:   Standing    Number of Occurrences:   1   No results found for this or any previous visit (from the past 24 hour(s)). No results found.   ED Clinical Impression  1. Encounter for laboratory testing for COVID-19 virus   2. Viral illness     ED Assessment/Plan  Pt describing typical pain, no sudden onset. Doubt SAH, ICH or space occupying lesion.  While patient  reports history of fevers, he is afebrile here in the department and has not taken antipyretic in the past 6 hours.  Pt has no meningeal sx, no nuchal rigidity. Doubt meningitis. Pt with normal neuro exam, no evidence of CVA/TIA.  Pt BP not elevated significantly, doubt hypertensive emergency. No evidence of temporal artery tenderness, no evidence of glaucoma or other ocular pathology.  Marland Kitchen COVID sent.  Will send home with Tylenol/ibuprofen, Zofran before bedtime.  Nausea and vomiting at night is unusual, but cavernous sinus thrombosis or meningitis low on the differential.  Follow-up with PMD in several days.  Strict ER return precautions given.  Work note for 2 days..  Discussed treatment plan, medical decision-making, plan for follow-up with patient.  Discussed signs and symptoms that should prompt return to the emergency department.  Patient agrees with plan.  Meds ordered this encounter  Medications  . ondansetron (ZOFRAN ODT) 8 MG disintegrating tablet    Sig: 1/2- 1 tablet q 8 hr prn nausea, vomiting    Dispense:  20 tablet    Refill:  0  . ibuprofen (ADVIL) 600 MG tablet    Sig: Take 1 tablet (600 mg total) by mouth every 6 (six) hours as needed.    Dispense:  30 tablet    Refill:  0    *This clinic note was created using Scientist, clinical (histocompatibility and immunogenetics). Therefore, there may be occasional mistakes despite careful proofreading.  ?    Domenick Gong, MD 05/24/20 [phone]

## 2020-05-23 NOTE — Discharge Instructions (Addendum)
Isolate until Covid test is back.  Take 600 mg of ibuprofen combined with 1000 mg of Tylenol 3-4 times a day as needed for headache.  Try taking some Zofran before going to bed.  Follow-up with your doctor urine 2 days.  Go immediately to the ER for neck stiffness, worst headache of your life, if lights are bothering you, rash, facial droop, arm or leg weakness, slurred speech, or for any other concerns.

## 2020-05-24 LAB — SARS-COV-2, NAA 2 DAY TAT

## 2020-05-24 LAB — NOVEL CORONAVIRUS, NAA: SARS-CoV-2, NAA: NOT DETECTED

## 2021-07-24 ENCOUNTER — Encounter: Payer: Self-pay | Admitting: Emergency Medicine

## 2021-07-24 ENCOUNTER — Ambulatory Visit
Admission: EM | Admit: 2021-07-24 | Discharge: 2021-07-24 | Disposition: A | Discharge status: Home / Self Care | Payer: 59 | Attending: Urgent Care | Admitting: Urgent Care

## 2021-07-24 DIAGNOSIS — M109 Gout, unspecified: Secondary | ICD-10-CM | POA: Diagnosis not present

## 2021-07-24 MED ORDER — INDOMETHACIN 50 MG PO CAPS: 50.0000 mg | ORAL_CAPSULE | Freq: Two times a day (BID) | ORAL | 0 refills | Status: AC

## 2021-07-24 NOTE — ED Provider Notes (Signed)
?[facility] ? ? ?MRN: [id number] DOB: Jul 01, 1975 ? ?Subjective:  ? ?Larry Shannon is a 46 y.o. male presenting for 3-day history of acute onset of recurrent left great toe pain with foot swelling, redness and warmth. Patient just got back from a cruise.  Was eating a lot of seafood and drinking a lot of alcohol.  Has a history of gout and is typically done well with indomethacin.  Has not had to take it in about 3 to 4 years.  No fall, trauma. ? ?No current facility-administered medications for this encounter. ? ?Current Outpatient Medications:  ?  albuterol (VENTOLIN HFA) 108 (90 Base) MCG/ACT inhaler, Inhale 2 puffs into the lungs every 4 (four) hours as needed for wheezing or shortness of breath., Disp: 18 g, Rfl: 0 ?  ibuprofen (ADVIL) 600 MG tablet, Take 1 tablet (600 mg total) by mouth every 6 (six) hours as needed., Disp: 30 tablet, Rfl: 0 ?  omeprazole (PRILOSEC) 20 MG capsule, Take 2 x a day for one week then once a day until gone, Disp: 30 capsule, Rfl: 0 ?  ondansetron (ZOFRAN ODT) 8 MG disintegrating tablet, 1/2- 1 tablet q 8 hr prn nausea, vomiting, Disp: 20 tablet, Rfl: 0  ? ?No Known Allergies ? ?Past Medical History:  ?Diagnosis Date  ? COVID-19   ? Gout   ?  ? ?History reviewed. No pertinent surgical history. ? ?Family History  ?Problem Relation Age of Onset  ? COPD Mother   ? ? ?Social History  ? ?Tobacco Use  ? Smoking status: Never  ? Smokeless tobacco: Never  ?Vaping Use  ? Vaping Use: Never used  ?Substance Use Topics  ? Alcohol use: Yes  ?  Comment: twice weekly  ? Drug use: No  ? ? ?ROS ? ? ?Objective:  ? ?Vitals: ?BP (!) 146/89 (BP Location: Right Arm)   Pulse 76   Temp 98.3 ?F (36.8 ?C) (Oral)   Resp 18   Ht 6\' 2"  (1.88 m)   Wt 260 lb (117.9 kg)   SpO2 97%   BMI 33.38 kg/m?  ? ?Physical Exam ?Constitutional:   ?   General: He is not in acute distress. ?   Appearance: Normal appearance. He is well-developed and normal weight. He is not ill-appearing, toxic-appearing or  diaphoretic.  ?HENT:  ?   Head: Normocephalic and atraumatic.  ?   Right Ear: External ear normal.  ?   Left Ear: External ear normal.  ?   Nose: Nose normal.  ?   Mouth/Throat:  ?   Pharynx: Oropharynx is clear.  ?Eyes:  ?   General: No scleral icterus.    ?   Right eye: No discharge.     ?   Left eye: No discharge.  ?   Extraocular Movements: Extraocular movements intact.  ?Cardiovascular:  ?   Rate and Rhythm: Normal rate.  ?Pulmonary:  ?   Effort: Pulmonary effort is normal.  ?Musculoskeletal:  ?   Cervical back: Normal range of motion.  ?     Feet: ? ?Neurological:  ?   Mental Status: He is alert and oriented to person, place, and time.  ?Psychiatric:     ?   Mood and Affect: Mood normal.     ?   Behavior: Behavior normal.     ?   Thought Content: Thought content normal.     ?   Judgment: Judgment normal.  ? ? ?Assessment and Plan :  ? ?PDMP not reviewed  this encounter. ? ?1. Acute gout involving toe of left foot, unspecified cause   ?2. Acute gout of left foot, unspecified cause   ? ?HPI and physical exam findings are consistent with recurrent gout attack.  Recommended indomethacin.  No history of CKD and in the absence of trauma, will defer imaging. Counseled patient on potential for adverse effects with medications prescribed/recommended today, ER and return-to-clinic precautions discussed, patient verbalized understanding. ? ?  ?Wallis Bamberg, PA-C ?07/24/21 1350 ? ?

## 2021-07-24 NOTE — ED Triage Notes (Signed)
Patient c/o left big toe pain from gout x 3 days.  Patient does have a history of gout but does not have a PCP at the current time.  Patient has been taken Ibuprofen and cherry juice. ?

## 2021-10-20 ENCOUNTER — Ambulatory Visit: Payer: Self-pay | Admitting: Family Medicine

## 2023-10-09 ENCOUNTER — Other Ambulatory Visit: Payer: Self-pay

## 2023-10-09 ENCOUNTER — Emergency Department (HOSPITAL_COMMUNITY)
Admission: EM | Admit: 2023-10-09 | Discharge: 2023-10-09 | Discharge status: Against Medical Advice | Attending: Emergency Medicine | Admitting: Emergency Medicine

## 2023-10-09 ENCOUNTER — Emergency Department (HOSPITAL_COMMUNITY)

## 2023-10-09 ENCOUNTER — Encounter (HOSPITAL_COMMUNITY): Payer: Self-pay | Admitting: Pharmacy Technician

## 2023-10-09 DIAGNOSIS — R0789 Other chest pain: Secondary | ICD-10-CM | POA: Diagnosis present

## 2023-10-09 DIAGNOSIS — R0602 Shortness of breath: Secondary | ICD-10-CM | POA: Diagnosis not present

## 2023-10-09 DIAGNOSIS — Z5321 Procedure and treatment not carried out due to patient leaving prior to being seen by health care provider: Secondary | ICD-10-CM | POA: Diagnosis not present

## 2023-10-09 LAB — BASIC METABOLIC PANEL WITH GFR
Anion gap: 14 (ref 5–15)
BUN: 7 mg/dL (ref 6–20)
CO2: 23 mmol/L (ref 22–32)
Calcium: 9.2 mg/dL (ref 8.9–10.3)
Chloride: 100 mmol/L (ref 98–111)
Creatinine, Ser: 0.93 mg/dL (ref 0.61–1.24)
GFR, Estimated: >60 mL/min (ref >60)
Glucose, Bld: 130 mg/dL — ABNORMAL HIGH (ref 70–99)
Potassium: 3.6 mmol/L (ref 3.5–5.1)
Sodium: 137 mmol/L (ref 135–145)

## 2023-10-09 LAB — CBC
HCT: 46.5 % (ref 39.0–52.0)
Hemoglobin: 14.8 g/dL (ref 13.0–17.0)
MCH: 27.9 pg (ref 26.0–34.0)
MCHC: 31.8 g/dL (ref 30.0–36.0)
MCV: 87.6 fL (ref 80.0–100.0)
Platelets: 223 K/uL (ref 150–400)
RBC: 5.31 MIL/uL (ref 4.22–5.81)
RDW: 12.3 % (ref 11.5–15.5)
WBC: 6.3 K/uL (ref 4.0–10.5)
nRBC: 0 % (ref 0.0–0.2)

## 2023-10-09 LAB — TROPONIN I (HIGH SENSITIVITY): Troponin I (High Sensitivity): 3 ng/L (ref <18)

## 2023-10-09 NOTE — ED Triage Notes (Signed)
 Pt did not answer multiple times for repeat vitals or repeat blood work.

## 2023-10-09 NOTE — ED Triage Notes (Signed)
 PT arrives via POV. PT reports for the past couple of months, he has been experiencing pain in left side of his chest that radiates to left jaw and left arm. PT reports associated sob. PT is AxOx4.

## 2023-10-09 NOTE — ED Provider Triage Note (Signed)
 Emergency Medicine Provider Triage Evaluation Note  Larry Shannon , a 48 y.o. male  was evaluated in triage.  Pt complains of chest pain left sided wrapping around his left shoulder, and also radiating to his left jaw. However, pain is worsen with left arm movement.  Has not taken any medication for improvement in symptoms. No prior hx of CAD, strong family hx of CAD.  Review of Systems  Positive: Chest pain Negative: Sob, fever,   Physical Exam  BP (!) 145/83 (BP Location: Right Arm)   Pulse 76   Temp 98.6 F (37 C)   Resp 18   SpO2 97%  Gen:   Awake, no distress   Resp:  Normal effort  MSK:   Moves extremities without difficulty  Other:    Medical Decision Making  Medically screening exam initiated at 2:35 PM.  Appropriate orders placed.  Larry Shannon was informed that the remainder of the evaluation will be completed by another provider, this initial triage assessment does not replace that evaluation, and the importance of remaining in the ED until their evaluation is complete.    Larry Beem, PA-C 10/09/23 1439
